# Patient Record
Sex: Female | Born: 1975 | Race: Black or African American | Hispanic: No | Smoking: Current every day smoker
Health system: Southern US, Community
[De-identification: ages and names within clinical notes are randomized; demographics above are authoritative.]

## PROBLEM LIST (undated history)

## (undated) DIAGNOSIS — N83209 Unspecified ovarian cyst, unspecified side: Secondary | ICD-10-CM

## (undated) DIAGNOSIS — E669 Obesity, unspecified: Secondary | ICD-10-CM

## (undated) DIAGNOSIS — Z720 Tobacco use: Secondary | ICD-10-CM

## (undated) DIAGNOSIS — G5602 Carpal tunnel syndrome, left upper limb: Secondary | ICD-10-CM

## (undated) DIAGNOSIS — N83201 Unspecified ovarian cyst, right side: Secondary | ICD-10-CM

## (undated) DIAGNOSIS — M5416 Radiculopathy, lumbar region: Secondary | ICD-10-CM

## (undated) DIAGNOSIS — M5412 Radiculopathy, cervical region: Secondary | ICD-10-CM

## (undated) HISTORY — DX: Unspecified ovarian cyst, right side: N83.201

## (undated) HISTORY — DX: Obesity, unspecified: E66.9

## (undated) HISTORY — DX: Unspecified ovarian cyst, unspecified side: N83.209

## (undated) HISTORY — PX: TONSILECTOMY, ADENOIDECTOMY, BILATERAL MYRINGOTOMY AND TUBES: SHX2538

## (undated) HISTORY — DX: Tobacco use: Z72.0

## (undated) HISTORY — DX: Carpal tunnel syndrome, left upper limb: G56.02

## (undated) HISTORY — DX: Radiculopathy, cervical region: M54.12

## (undated) HISTORY — DX: Radiculopathy, lumbar region: M54.16

---

## 1998-04-10 ENCOUNTER — Encounter: Admission: RE | Admit: 1998-04-10 | Discharge: 1998-04-10 | Payer: Self-pay | Admitting: Family Medicine

## 1998-11-16 ENCOUNTER — Emergency Department (HOSPITAL_COMMUNITY): Admission: EM | Admit: 1998-11-16 | Discharge: 1998-11-17 | Payer: Self-pay | Admitting: Emergency Medicine

## 1998-11-17 ENCOUNTER — Inpatient Hospital Stay (HOSPITAL_COMMUNITY): Admission: AD | Admit: 1998-11-17 | Discharge: 1998-11-17 | Payer: Self-pay | Admitting: Obstetrics

## 1998-11-26 ENCOUNTER — Encounter: Admission: RE | Admit: 1998-11-26 | Discharge: 1998-11-26 | Payer: Self-pay | Admitting: Family Medicine

## 2000-04-15 ENCOUNTER — Ambulatory Visit (HOSPITAL_COMMUNITY): Admission: RE | Admit: 2000-04-15 | Discharge: 2000-04-15 | Payer: Self-pay | Admitting: *Deleted

## 2000-05-11 ENCOUNTER — Encounter: Admission: RE | Admit: 2000-05-11 | Discharge: 2000-05-11 | Payer: Self-pay | Admitting: Obstetrics & Gynecology

## 2001-10-22 ENCOUNTER — Inpatient Hospital Stay (HOSPITAL_COMMUNITY): Admission: AD | Admit: 2001-10-22 | Discharge: 2001-10-24 | Payer: Self-pay | Admitting: Obstetrics and Gynecology

## 2002-06-24 HISTORY — PX: TUBAL LIGATION: SHX77

## 2002-09-22 ENCOUNTER — Emergency Department (HOSPITAL_COMMUNITY): Admission: EM | Admit: 2002-09-22 | Discharge: 2002-09-23 | Payer: Self-pay

## 2002-09-29 ENCOUNTER — Emergency Department (HOSPITAL_COMMUNITY): Admission: EM | Admit: 2002-09-29 | Discharge: 2002-09-29 | Payer: Self-pay | Admitting: Emergency Medicine

## 2002-09-29 ENCOUNTER — Encounter: Payer: Self-pay | Admitting: Emergency Medicine

## 2002-11-27 ENCOUNTER — Emergency Department (HOSPITAL_COMMUNITY): Admission: EM | Admit: 2002-11-27 | Discharge: 2002-11-27 | Payer: Self-pay | Admitting: Emergency Medicine

## 2002-12-25 ENCOUNTER — Encounter: Admission: RE | Admit: 2002-12-25 | Discharge: 2002-12-25 | Payer: Self-pay | Admitting: Internal Medicine

## 2003-01-01 ENCOUNTER — Ambulatory Visit (HOSPITAL_COMMUNITY): Admission: RE | Admit: 2003-01-01 | Discharge: 2003-01-01 | Payer: Self-pay | Admitting: Internal Medicine

## 2003-01-01 ENCOUNTER — Encounter: Payer: Self-pay | Admitting: Internal Medicine

## 2003-01-08 ENCOUNTER — Encounter: Admission: RE | Admit: 2003-01-08 | Discharge: 2003-04-08 | Payer: Self-pay | Admitting: Infectious Diseases

## 2003-02-05 ENCOUNTER — Inpatient Hospital Stay (HOSPITAL_COMMUNITY): Admission: AD | Admit: 2003-02-05 | Discharge: 2003-02-06 | Payer: Self-pay | Admitting: *Deleted

## 2003-05-07 ENCOUNTER — Emergency Department (HOSPITAL_COMMUNITY): Admission: EM | Admit: 2003-05-07 | Discharge: 2003-05-07 | Payer: Self-pay | Admitting: Emergency Medicine

## 2003-06-25 ENCOUNTER — Encounter (INDEPENDENT_AMBULATORY_CARE_PROVIDER_SITE_OTHER): Payer: Self-pay | Admitting: *Deleted

## 2003-07-15 ENCOUNTER — Encounter (INDEPENDENT_AMBULATORY_CARE_PROVIDER_SITE_OTHER): Payer: Self-pay | Admitting: *Deleted

## 2003-07-15 ENCOUNTER — Encounter: Admission: RE | Admit: 2003-07-15 | Discharge: 2003-07-15 | Payer: Self-pay | Admitting: Family Medicine

## 2003-07-15 ENCOUNTER — Other Ambulatory Visit: Admission: RE | Admit: 2003-07-15 | Discharge: 2003-07-15 | Payer: Self-pay | Admitting: Family Medicine

## 2003-08-01 ENCOUNTER — Encounter: Admission: RE | Admit: 2003-08-01 | Discharge: 2003-08-01 | Payer: Self-pay | Admitting: Sports Medicine

## 2003-08-05 ENCOUNTER — Encounter: Admission: RE | Admit: 2003-08-05 | Discharge: 2003-08-05 | Payer: Self-pay | Admitting: Sports Medicine

## 2003-08-22 ENCOUNTER — Encounter: Admission: RE | Admit: 2003-08-22 | Discharge: 2003-08-22 | Payer: Self-pay | Admitting: Family Medicine

## 2003-09-09 ENCOUNTER — Encounter: Admission: RE | Admit: 2003-09-09 | Discharge: 2003-09-09 | Payer: Self-pay | Admitting: Family Medicine

## 2003-09-09 ENCOUNTER — Encounter: Admission: RE | Admit: 2003-09-09 | Discharge: 2003-09-09 | Payer: Self-pay | Admitting: Sports Medicine

## 2003-09-18 ENCOUNTER — Encounter: Admission: RE | Admit: 2003-09-18 | Discharge: 2003-09-18 | Payer: Self-pay | Admitting: Family Medicine

## 2006-07-22 ENCOUNTER — Encounter (INDEPENDENT_AMBULATORY_CARE_PROVIDER_SITE_OTHER): Payer: Self-pay | Admitting: *Deleted

## 2007-06-20 ENCOUNTER — Emergency Department (HOSPITAL_COMMUNITY): Admission: EM | Admit: 2007-06-20 | Discharge: 2007-06-21 | Payer: Self-pay | Admitting: Emergency Medicine

## 2007-10-03 ENCOUNTER — Ambulatory Visit: Payer: Self-pay | Admitting: Family Medicine

## 2007-10-04 ENCOUNTER — Emergency Department (HOSPITAL_COMMUNITY): Admission: EM | Admit: 2007-10-04 | Discharge: 2007-10-04 | Payer: Self-pay | Admitting: Emergency Medicine

## 2007-10-24 ENCOUNTER — Ambulatory Visit: Payer: Self-pay | Admitting: Family Medicine

## 2007-10-24 ENCOUNTER — Encounter (INDEPENDENT_AMBULATORY_CARE_PROVIDER_SITE_OTHER): Payer: Self-pay | Admitting: Family Medicine

## 2007-10-24 ENCOUNTER — Other Ambulatory Visit: Admission: RE | Admit: 2007-10-24 | Discharge: 2007-10-24 | Payer: Self-pay | Admitting: Family Medicine

## 2007-10-24 DIAGNOSIS — F17200 Nicotine dependence, unspecified, uncomplicated: Secondary | ICD-10-CM | POA: Insufficient documentation

## 2007-10-30 ENCOUNTER — Encounter (INDEPENDENT_AMBULATORY_CARE_PROVIDER_SITE_OTHER): Payer: Self-pay | Admitting: Family Medicine

## 2007-10-30 ENCOUNTER — Ambulatory Visit: Payer: Self-pay | Admitting: Family Medicine

## 2007-10-30 DIAGNOSIS — E669 Obesity, unspecified: Secondary | ICD-10-CM

## 2007-10-30 LAB — CONVERTED CEMR LAB
ALT: 11 units/L (ref 0–35)
Alkaline Phosphatase: 63 units/L (ref 39–117)
Creatinine, Ser: 0.68 mg/dL (ref 0.40–1.20)
LDL Cholesterol: 82 mg/dL (ref 0–99)
MCHC: 33.7 g/dL (ref 30.0–36.0)
MCV: 98.6 fL (ref 78.0–100.0)
Platelets: 328 10*3/uL (ref 150–400)
Sodium: 137 meq/L (ref 135–145)
Total Bilirubin: 0.6 mg/dL (ref 0.3–1.2)
Total CHOL/HDL Ratio: 2.8
Total Protein: 6.6 g/dL (ref 6.0–8.3)
Triglycerides: 50 mg/dL (ref <150)
VLDL: 10 mg/dL (ref 0–40)

## 2007-10-31 ENCOUNTER — Encounter (INDEPENDENT_AMBULATORY_CARE_PROVIDER_SITE_OTHER): Payer: Self-pay | Admitting: Family Medicine

## 2007-11-13 ENCOUNTER — Encounter (INDEPENDENT_AMBULATORY_CARE_PROVIDER_SITE_OTHER): Payer: Self-pay | Admitting: Family Medicine

## 2007-12-07 ENCOUNTER — Ambulatory Visit: Payer: Self-pay | Admitting: Family Medicine

## 2008-05-07 ENCOUNTER — Encounter: Admission: RE | Admit: 2008-05-07 | Discharge: 2008-05-07 | Payer: Self-pay | Admitting: Family Medicine

## 2008-05-07 ENCOUNTER — Ambulatory Visit: Payer: Self-pay | Admitting: Family Medicine

## 2008-05-07 ENCOUNTER — Telehealth: Payer: Self-pay | Admitting: *Deleted

## 2008-05-09 ENCOUNTER — Encounter: Payer: Self-pay | Admitting: *Deleted

## 2008-07-25 ENCOUNTER — Ambulatory Visit: Payer: Self-pay | Admitting: Family Medicine

## 2008-07-25 ENCOUNTER — Encounter: Payer: Self-pay | Admitting: Family Medicine

## 2008-07-25 ENCOUNTER — Other Ambulatory Visit: Admission: RE | Admit: 2008-07-25 | Discharge: 2008-07-25 | Payer: Self-pay | Admitting: Family Medicine

## 2008-07-25 LAB — CONVERTED CEMR LAB
Albumin: 4.1 g/dL (ref 3.5–5.2)
Alkaline Phosphatase: 70 units/L (ref 39–117)
BUN: 7 mg/dL (ref 6–23)
CO2: 22 meq/L (ref 19–32)
Calcium: 9.2 mg/dL (ref 8.4–10.5)
Glucose, Bld: 81 mg/dL (ref 70–99)
Hgb A1c MFr Bld: 5.2 %
Potassium: 3.9 meq/L (ref 3.5–5.3)
TSH: 0.967 microintl units/mL (ref 0.350–4.50)
Whiff Test: POSITIVE

## 2008-07-26 ENCOUNTER — Encounter: Payer: Self-pay | Admitting: Family Medicine

## 2008-08-06 ENCOUNTER — Emergency Department (HOSPITAL_COMMUNITY): Admission: EM | Admit: 2008-08-06 | Discharge: 2008-08-07 | Payer: Self-pay | Admitting: Emergency Medicine

## 2008-08-19 ENCOUNTER — Ambulatory Visit: Payer: Self-pay | Admitting: Family Medicine

## 2008-08-20 ENCOUNTER — Telehealth (INDEPENDENT_AMBULATORY_CARE_PROVIDER_SITE_OTHER): Payer: Self-pay | Admitting: Pharmacist

## 2008-09-05 ENCOUNTER — Ambulatory Visit: Payer: Self-pay | Admitting: Family Medicine

## 2008-09-05 ENCOUNTER — Telehealth: Payer: Self-pay | Admitting: Family Medicine

## 2008-12-22 ENCOUNTER — Emergency Department (HOSPITAL_COMMUNITY): Admission: EM | Admit: 2008-12-22 | Discharge: 2008-12-22 | Payer: Self-pay | Admitting: Family Medicine

## 2009-04-22 ENCOUNTER — Emergency Department (HOSPITAL_COMMUNITY): Admission: EM | Admit: 2009-04-22 | Discharge: 2009-04-22 | Payer: Self-pay | Admitting: Emergency Medicine

## 2009-05-01 ENCOUNTER — Ambulatory Visit: Payer: Self-pay | Admitting: Family Medicine

## 2009-07-17 ENCOUNTER — Ambulatory Visit: Payer: Self-pay | Admitting: Family Medicine

## 2009-08-19 ENCOUNTER — Ambulatory Visit: Payer: Self-pay | Admitting: Family Medicine

## 2009-08-19 ENCOUNTER — Encounter: Payer: Self-pay | Admitting: Family Medicine

## 2009-08-19 LAB — CONVERTED CEMR LAB
ALT: 9 units/L (ref 0–35)
AST: 13 units/L (ref 0–37)
Albumin: 3.9 g/dL (ref 3.5–5.2)
CO2: 19 meq/L (ref 19–32)
Calcium: 9.1 mg/dL (ref 8.4–10.5)
Chloride: 107 meq/L (ref 96–112)
Cholesterol: 144 mg/dL (ref 0–200)
HCT: 42.8 % (ref 36.0–46.0)
Platelets: 298 10*3/uL (ref 150–400)
Potassium: 4.4 meq/L (ref 3.5–5.3)
RDW: 14.5 % (ref 11.5–15.5)
Sodium: 138 meq/L (ref 135–145)
TSH: 1.166 microintl units/mL (ref 0.350–4.500)
Total CK: 31 units/L (ref 7–177)
Total Protein: 6.6 g/dL (ref 6.0–8.3)
Vit D, 25-Hydroxy: <10 ng/mL — ABNORMAL LOW (ref 30–89)
WBC: 5.5 10*3/uL (ref 4.0–10.5)

## 2009-08-27 ENCOUNTER — Ambulatory Visit: Payer: Self-pay | Admitting: Family Medicine

## 2009-08-27 ENCOUNTER — Other Ambulatory Visit: Admission: RE | Admit: 2009-08-27 | Discharge: 2009-08-27 | Payer: Self-pay | Admitting: Family Medicine

## 2009-08-27 ENCOUNTER — Encounter: Payer: Self-pay | Admitting: Family Medicine

## 2009-08-27 LAB — CONVERTED CEMR LAB
Chlamydia, DNA Probe: NEGATIVE
Whiff Test: POSITIVE

## 2009-08-31 ENCOUNTER — Encounter: Payer: Self-pay | Admitting: Family Medicine

## 2009-11-10 ENCOUNTER — Telehealth: Payer: Self-pay | Admitting: Family Medicine

## 2009-11-12 ENCOUNTER — Ambulatory Visit: Payer: Self-pay | Admitting: Family Medicine

## 2009-11-13 ENCOUNTER — Ambulatory Visit (HOSPITAL_COMMUNITY): Admission: RE | Admit: 2009-11-13 | Discharge: 2009-11-13 | Payer: Self-pay | Admitting: Family Medicine

## 2010-01-02 ENCOUNTER — Ambulatory Visit: Payer: Self-pay | Admitting: Family Medicine

## 2010-02-19 ENCOUNTER — Ambulatory Visit: Payer: Self-pay | Admitting: Family Medicine

## 2010-02-19 ENCOUNTER — Telehealth: Payer: Self-pay | Admitting: *Deleted

## 2010-02-19 DIAGNOSIS — IMO0002 Reserved for concepts with insufficient information to code with codable children: Secondary | ICD-10-CM

## 2010-02-19 DIAGNOSIS — M5412 Radiculopathy, cervical region: Secondary | ICD-10-CM | POA: Insufficient documentation

## 2010-02-20 ENCOUNTER — Encounter: Admission: RE | Admit: 2010-02-20 | Discharge: 2010-02-20 | Payer: Self-pay | Admitting: Sports Medicine

## 2010-03-12 ENCOUNTER — Ambulatory Visit: Payer: Self-pay | Admitting: Radiology

## 2010-03-12 ENCOUNTER — Emergency Department (HOSPITAL_BASED_OUTPATIENT_CLINIC_OR_DEPARTMENT_OTHER): Admission: EM | Admit: 2010-03-12 | Discharge: 2010-03-12 | Payer: Self-pay | Admitting: Emergency Medicine

## 2010-03-17 ENCOUNTER — Ambulatory Visit: Payer: Self-pay | Admitting: Family Medicine

## 2010-03-17 DIAGNOSIS — N83209 Unspecified ovarian cyst, unspecified side: Secondary | ICD-10-CM

## 2010-03-17 HISTORY — DX: Unspecified ovarian cyst, unspecified side: N83.209

## 2010-03-20 ENCOUNTER — Telehealth: Payer: Self-pay | Admitting: *Deleted

## 2010-03-23 ENCOUNTER — Telehealth: Payer: Self-pay | Admitting: *Deleted

## 2010-04-01 ENCOUNTER — Encounter: Payer: Self-pay | Admitting: Family Medicine

## 2010-06-23 NOTE — Assessment & Plan Note (Signed)
Summary: pap/eo   Vital Signs:  Patient profile:   35 year old female Height:      63.5 inches Weight:      253 pounds BMI:     44.27 Temp:     98.0 degrees F oral Pulse rate:   87 / minute BP sitting:   117 / 75  (right arm) Cuff size:   large  Vitals Entered By: Tessie Fass CMA (August 27, 2009 9:41 AM) CC: pap Is Patient Diabetic? No Pain Assessment Patient in pain? yes     Location: right leg Intensity: 7   Primary Care Provider:  Lynzi Meulemans MD  CC:  pap.  History of Present Illness: 35 y/o healthy F here for pap  GYN:  LMP March 12-13.  Regulary 3 1/2 days.  Heavy bleeding first 2 days.  Wears both tampons+pads for first two days.  Not interested in OCP to regulate cycle.  Sexually active with boyfriend. +discharge (white)  Obesity:  Would like to lose weight.  States today "I can't weight 135 lbs like the chart says I need to weight.  I would be too skinny then".  She states that she does not have time to exercise.  She attends college, works, and has 3 children.  Not healthy diet.    Habits & Providers  Alcohol-Tobacco-Diet     Tobacco Status: current     Cigarette Packs/Day: 0.75  Current Medications (verified): 1)  None  Allergies (verified): 1)  Sulfa  Past History:  Past Medical History: Last updated: 07/17/2009 Chlamydia 2/05, neg HIV, RPR; Chl TOC neg,  [id number],  h/o abnl pap s/p cryotx, subsequent nl pap, R ATF sprain 4/05 Trich 3/10.  Past Surgical History: Last updated: 07/17/2009 BTL - 05/24/2002 Pelvic US- prob. fibroid dz, nl ovaries - 08/22/2003,  T & A - 07/15/2003  Family History: Last updated: 07/17/2009 -gma with CVD and CAD, HTN, DM, - multiple relatives w/ fibroids requiring TAH,  -PGM passed from cervical cancer at age 45s -No colon ca  Social History: Last updated: 08/27/2009 Divorced 2009.  Boyfriend Jimmy. Sexually active. Using condoms. Cutting down on smoking  ~1/2 ppd (used to smoke up to 2 ppds, started when she was  35 y/o).   Social alcohol.  No rec drugs.   Living with 3 kids (13, 8, 7).   [employer] full time. Plans to graduate 04/2011 with BS in Psychology.  Risk Factors: Alcohol Use: <1 (07/17/2009) Diet: Needs to eat more healthy, check portions (07/17/2009) Exercise: no (07/17/2009)  Risk Factors: Smoking Status: current (08/27/2009) Packs/Day: 0.75 (08/27/2009)  Social History: Divorced 2009.  Boyfriend Jimmy. Sexually active. Using condoms. Cutting down on smoking  ~1/2 ppd (used to smoke up to 2 ppds, started when she was 35 y/o).   Social alcohol.  No rec drugs.   Living with 3 kids (13, 8, 7).   [employer] full time. Plans to graduate 04/2011 with BS in Psychology. Packs/Day:  0.75  Review of Systems General:  Denies chills, fever, and loss of appetite. GU:  Denies abnormal vaginal bleeding, discharge, dysuria, genital sores, hematuria, incontinence, and urinary frequency.  Physical Exam  General:  Well-developed,well-nourished,in no acute distress; alert,appropriate and cooperative throughout examination. vitals reviewed. Abdomen:  Bowel sounds positive,abdomen soft and non-tender without masses, organomegaly or hernias noted. obese Genitalia:  Normal introitus for age, no external lesions, +malodorous vaginal discharge, mucosa pink and moist, no vaginal or cervical lesions, no vaginal atrophy, no friaility or hemorrhage, normal  uterus size and position, no adnexal masses or tenderness Neurologic:  alert & oriented X3 and cranial nerves II-XII intact.   Psych:  Oriented X3 and memory intact for recent and remote.     Impression & Recommendations:  Problem # 1:  Gynecological examination-routine (ICD-V72.31) Assessment Unchanged Exam wnl, but + malodorous discharge.  Wet prep + clue cells + whiff test.  Tried to call pt at phone number she gave me [phone], but automated message states "This subscriber is not accepting calls at this time."  Will send letter to  home and [facility] to [facility] on [address].    Problem # 2:  CONTACT OR EXPOSURE TO OTHER VIRAL DISEASES (ICD-V01.79) GC/Chlamydia sample done. Orders: GC/Chlamydia-[facility] ([id number])  Problem # 3:  OBESITY, UNSPECIFIED (ICD-278.00) Assessment: Deteriorated  Pt continues to state that she does not have time to exercise.  Did not follow calories-count plan given at last visit.  She is interested in talking with Dr Gerilyn Pilgrim.  Pt to make appt.   Orders: [facility]- Est Level  3 (09811)  Problem # 4:  NICOTINE ADDICTION (ICD-305.1) Assessment: Unchanged Not interested in quitting.   Complete Medication List: 1)  Metronidazole 500 Mg Tabs (Metronidazole) .Marland Kitchen.. 1 tab by mouth two times a day x 7 days  Other Orders: Pap Smear-[facility] ([id number]) [facility] (13086) Pap Smear- [facility] (Pap)  Patient Instructions: 1)  Please schedule a follow-up appointment in 4-6 months after you've seen Dr Riley Kill .  2)  Make appointment with Dr Gerilyn Pilgrim for Nutrition support.  Prescriptions: METRONIDAZOLE 500 MG TABS (METRONIDAZOLE) 1 tab by mouth two times a day x 7 days  #14 x 0   Entered and Authorized by:   Angeline Slim MD   Signed by:   Angeline Slim MD on 08/27/2009   Method used:   [facility] to        [facility] [phone][facility] (retail)       [address]       [place], [place]  [address]       Ph: [phone]       Fax: [phone]   RxID:   [id number]   Laboratory Results  Date/Time Received: August 27, 2009 10:17 AM  Date/Time Reported: August 27, 2009 10:31 AM   [place] Source: vaginal WBC/hpf: 1-5 Bacteria/hpf: 3+  Cocci Clue cells/hpf: few  Positive whiff Yeast/hpf: none Trichomonas/hpf: none Comments: rod bacteria also present...........test performed by...........Marland KitchenTerese Door, CMA

## 2010-06-23 NOTE — Assessment & Plan Note (Signed)
Summary: cpe,df   Vital Signs:  Patient profile:   35 year old female Height:      63.5 inches Weight:      247 pounds BMI:     43.22 Temp:     97.8 degrees F oral Pulse rate:   94 / minute BP sitting:   112 / 76  (left arm) Cuff size:   large  Vitals Entered By: Tessie Fass CMA (July 17, 2009 9:04 AM) CC: complete physical Is Patient Diabetic? No Pain Assessment Patient in pain? no        Primary Care Provider:  Aleathea Pugmire MD  CC:  complete physical.  History of Present Illness: cc: cpe  R knee pain: started 1 month ago, does not recall injury.  Whole right leg feels tingly, numbness.  No urinary or bowel problems.  No swellilng.  No erythema.  Constant pain.  Right knee feels like it slips out on her.  Feels like she is falling due to leg numbness.  Upon further questioning pt states it feels more like numbness.  No pain.  No urinary or bowel incontinence.  no fever, chills.    Obesity: not exercising.  not dieting.  not paying attention to what she is eating.    Tobacco abuse:  smoking 1/2 ppd.  Started at age 77.  Used to smoke 2ppd.     Habits & Providers  Alcohol-Tobacco-Diet     Alcohol drinks/day: <1     Alcohol Counseling: not indicated; patient does not drink     Tobacco Status: current     Tobacco Counseling: to quit use of tobacco products     Cigarette Packs/Day: 0.5     Year Started: 1991     Diet Comments: Needs to eat more healthy, check portions  Exercise-Depression-Behavior     Does Patient Exercise: no     Exercise Counseling: to improve exercise regimen     Have you felt down or hopeless? no     Have you felt little pleasure in things? no     Depression Counseling: not indicated; screening negative for depression     STD Risk: never     Drug Use: never     Seat Belt Use: sometimes     Seat Belt Counseling: Uses seatbelt when kids are in the car     Sun Exposure: infrequent  Current Medications (verified): 1)  None  Allergies  (verified): 1)  Sulfa  Past History:  Past Medical History: Chlamydia 2/05, neg HIV, RPR; Chl TOC neg,  [id number],  h/o abnl pap s/p cryotx, subsequent nl pap, R ATF sprain 4/05 Trich 3/10.  Past Surgical History: BTL - 05/24/2002 Pelvic US- prob. fibroid dz, nl ovaries - 08/22/2003,  T & A - 07/15/2003  Family History: -gma with CVD and CAD, HTN, DM, - multiple relatives w/ fibroids requiring TAH,  -PGM passed from cervical cancer at age 66s -No colon ca  Social History: Divorced 2009.  Boyfriend Chanetta Marshall of 4 months. Sexually active. Using condoms. Cutting down on smoking  ~1/2 ppd (used to smoke up to 2 ppds, started when she was 35 y/o).   Social alcohol.  No rec drugs.   Living with 3 kids (13, 8, 7).   [employer] full time. Plans to graduate 09/2010 with BS in Psychology. Packs/Day:  0.5 Does Patient Exercise:  no STD Risk:  never Drug Use:  never Seat Belt Use:  sometimes Sun Exposure-Excessive:  infrequent  Review of Systems  The patient denies anorexia, fever, weight loss, vision loss, decreased hearing, hoarseness, chest pain, syncope, dyspnea on exertion, peripheral edema, prolonged cough, headaches, hemoptysis, abdominal pain, melena, hematochezia, severe indigestion/heartburn, hematuria, incontinence, genital sores, muscle weakness, suspicious skin lesions, transient blindness, depression, unusual weight change, abnormal bleeding, enlarged lymph nodes, and breast masses.    Physical Exam  General:  Well-developed,well-nourished,in no acute distress; alert,appropriate and cooperative throughout examination. vitals reviewed.  Head:  Normocephalic and atraumatic without obvious abnormalities. No apparent alopecia or balding. Eyes:  vision grossly intact.   Ears:  External ear exam shows no significant lesions or deformities.  Otoscopic examination reveals clear canals, tympanic membranes are intact bilaterally without bulging, retraction, inflammation or discharge.  Hearing is grossly normal bilaterally. Nose:  External nasal examination shows no deformity or inflammation. Nasal mucosa are pink and moist without lesions or exudates. Mouth:  Oral mucosa and oropharynx without lesions or exudates.  Teeth in good repair. Neck:  supple and full ROM.   Lungs:  Normal respiratory effort, chest expands symmetrically. Lungs are clear to auscultation, no crackles or wheezes. Heart:  Normal rate and regular rhythm. S1 and S2 normal without gallop, murmur, click, rub or other extra sounds. Abdomen:  Bowel sounds positive,abdomen soft and non-tender without masses, organomegaly or hernias noted. obese.  Msk:  No deformity or scoliosis noted of thoracic or lumbar spine.   Pulses:  R and L carotid,radial,femoral,dorsalis pedis and posterior tibial pulses are full and equal bilaterally Extremities:  No clubbing, cyanosis, edema, or deformity noted with normal full range of motion of all joints.    Right leg: no hip or knee joint pain. No swelling.  no redness.  knee and hip stable.  gait stable.  straight leg raise no tenderness.  no locking.  extension/flexsion intact and no pain.  Neurologic:  No cranial nerve deficits noted. Station and gait are normal. Plantar reflexes are down-going bilaterally. DTRs are symmetrical throughout. Sensory, motor and coordinative functions appear intact. Skin:  Intact without suspicious lesions or rashes Cervical Nodes:  No lymphadenopathy noted Axillary Nodes:  No palpable lymphadenopathy Psych:  Oriented X3, memory intact for recent and remote, normally interactive, good eye contact, not anxious appearing, not depressed appearing, not agitated, not suicidal, and not homicidal.     Impression & Recommendations:  Problem # 1:  NUMBNESS (ICD-782.0) Numbness in legs most likely from impingement of femoral nerve due to obesity.  Exam negative.  No urinary/bowel symptoms.  Full ROM.  No joint pain.  Will check CK, but most likely will be  normal.  Would need weight loss.  Future Orders: CK (Creatine Kinase)-[facility] [id number]) ... 06/26/2010  Problem # 2:  NICOTINE ADDICTION (ICD-305.1) Assessment: Unchanged Advised pt to quit.  She is not interested in quitting at this time, although she states she has tried in past and is trying to cut down.    Problem # 3:  OBESITY, UNSPECIFIED (ICD-278.00) Assessment: Unchanged BMI 43.  Pt not exercising.  She states she does not have time to exercise.  Pt has treadmill in apt complex where she lives.  Advised pt to use the treadmill and walk while she is reading school work.  We discussed food portions (Dr Gerilyn Pilgrim' 1/2 plate vegetables, palm-sized for proteins and starch).  Will check TSH.   Future Orders: Lipid-[facility] ([id number]) ... 06/27/2010 Comp Met-[facility] ([id number]) ... 07/02/2010 CBC-[facility] ([id number]) ... 06/26/2010 TSH-[facility] ([id number]) ... 06/26/2010 CK (Creatine Kinase)-[facility] [id number]) ... 06/26/2010  Problem # 4:  SCREENING FOR MALIGNANT NEOPLASM OF  THE CERVIX (ICD-V76.2) Assessment: Comment Only Will need to come back for this test.  Hx of abnormal pap 2005.  Pap 2009, 2010 negative.  If this one negative then can repeat in 3 yrs.   Other Orders: Future Orders: Vit D, 25 OH-[facility] ([id number]) ... 06/25/2010  Patient Instructions: 1)  Please schedule a follow-up appointment in March for pap.  2)  Please make appt for cholesterol check one week before pap appointment.  This has to be fasting, so no food or drink after midnight the night before appointment. 3)  Tobacco is very bad for your health and your loved ones ! You should stop smoking !  4)  Stop smoking tips: Choose a quit date. Cut down before the quit date. Decide what you will do as a substitute when you feel the urge to smoke(gum, toothpick, exercise).  5)  It is important that you exercise reguarly at least 20 minutes 5 times a week. If you develop chest pain, have severe difficulty breathing, or feel very tired, stop  exercising immediately and seek medical attention.  6)  You need to lose weight. Consider a lower calorie diet and regular exercise.    Prevention & Chronic Care Immunizations   Influenza vaccine: Not documented    Tetanus booster: 06/25/2003: Done.   Tetanus booster due: 06/24/2013    Pneumococcal vaccine: Not documented  Other Screening   Pap smear: NEGATIVE FOR INTRAEPITHELIAL LESIONS OR MALIGNANCY.  (07/25/2008)   Pap smear due: 06/24/2004   Smoking status: current  (07/17/2009)   Smoking cessation counseling: YES  (07/17/2009)   Target quit date: 08/19/2008  (08/19/2008)  Appended Document: cpe,df
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
## 2010-06-23 NOTE — Assessment & Plan Note (Signed)
Summary: dizzy/lightheaded,df   Vital Signs:  Patient profile:   35 year old female Height:      63.5 inches Weight:      243.8 pounds BMI:     42.66 Temp:     98.4 degrees F oral Pulse rate:   91 / minute Pulse (ortho):   99 / minute BP sitting:   109 / 73  (left arm) BP standing:   103 / 73 Cuff size:   large  Vitals Entered By: Garen Grams LPN (January 02, 2010 2:12 PM) CC: dizziness and headache x 2 days Is Patient Diabetic? No Pain Assessment Patient in pain? no        Serial Vital Signs/Assessments:  Time      Position  BP       Pulse  Resp  Temp     By 2:13 PM   Lying RA  105/72   85                    Asha Benton LPN 1:61 PM   Sitting   102/71   93                    Asha Benton LPN 0:96 PM   Standing  103/73   99                    Asha Benton LPN   Primary Care Provider:  Cat Ta MD  CC:  dizziness and headache x 2 days.  History of Present Illness: CC: left side acting funny  1d h/o sharp pain left head, dizzy spells.  Pt was seen for this previously in June and had gone away with time and alittle flexaril.     HA - left sided sharp pain seems worse with changing positions from sitting or standing, "seeing stars".  No photo/phonophobia. Pt .  Makes her stopoes report some nausea associated with it buut no vomiting.  + h/o migraines but normally has photo/nausea associated with them.  Neck muscles are always  tight, more tight now due to stress in life.   dizzyness - feels room spinning (vertigo), no double vision, lightheaded, imbalanced,   No feelings of presyncope.  + postural hypotension.  Lasts seconds.  denies falling denies trauma no hearing problem no ringing in ears   No confusion, AMS, anmesia, urinary changes, CP, SOB, abd pain, recent fevers, chills.  Tried excedrin for headache which didn't really help. Has flexaril from last time this occured but has only taken one so far.  Smokes <1/2 ppd.  No herbals/supplements.  CT head in june  normal  Habits & Providers  Alcohol-Tobacco-Diet     Tobacco Status: current     Tobacco Counseling: to quit use of tobacco products     Cigarette Packs/Day: 0.5  Current Medications (verified): 1)  Naprosyn 500 Mg Tabs (Naproxen) .... Two Times A Day For 7 Days With Food Then As Needed 2)  Flexeril 5 Mg Tabs (Cyclobenzaprine Hcl) .... Take One By Mouth Three Times A Day As Needed Headaches 3)  Amoxicillin-Pot Clavulanate 875-125 Mg Tabs (Amoxicillin-Pot Clavulanate) .... Take 1 Tab By Mouth Two Times A Day 4)  Ondansetron Hcl 4 Mg Tabs (Ondansetron Hcl) .... Take 1 Tab By Mouth Q8hr As Needed For Nausea  Allergies (verified): 1)  Sulfa  Review of Systems       see hpi  Physical Exam  General:  Well-developed,well-nourished,in no acute distress; alert,appropriate and  cooperative throughout examination. vitals reviewed. Pt thoug slumped over with head on sink area.  Head:  Normocephalic and atraumatic without obvious abnormalities. No apparent alopecia or balding. Eyes:  No corneal or conjunctival inflammation noted. EOMI. Perrla. Funduscopic exam benign, without  papilledema. Vision grossly normal. Ears:  External ear exam shows no significant lesions or deformities.  Otoscopic examination reveals clear canals, LeftTMr is retracted red no discharge . Hearing is grossly normal bilaterally. Nose:  External nasal examination shows no deformity or inflammation. Nasal mucosa are pink and moist without lesions or exudates. Mouth:  Oral mucosa and oropharynx without lesions or exudates.  Teeth in good repair. Lungs:  Normal respiratory effort, chest expands symmetrically. Lungs are clear to auscultation, no crackles or wheezes. Heart:  Normal rate and regular rhythm. S1 and S2 normal without gallop, murmur, click, rub or other extra sounds. Pulses:  R and L carotid,radial,femoral,dorsalis pedis and posterior tibial pulses are full and equal bilaterally Extremities:  nonpitting edema Skin:   Intact without suspicious lesions or rashes   Impression & Recommendations:  Problem # 1:  OTITIS MEDIA (ICD-382.9) Pt has signs of eaar infection could be causing inflammation of the CN 8.  Will treat with augmention x 1 week, use naproxen for inflammation.  Other possibilities include BPPV, labrinitis, meniere;s disease.  If does not help may think of trying diueretic.  Her updated medication list for this problem includes:    Naprosyn 500 Mg Tabs (Naproxen) .Marland Kitchen..Marland Kitchen Two times a day for 7 days with food then as needed    Amoxicillin-pot Clavulanate 875-125 Mg Tabs (Amoxicillin-pot clavulanate) .Marland Kitchen... Take 1 tab by mouth two times a day  Orders: [facility]- Est Level  3 ([address])  Problem # 2:  HEADACHE (ICD-784.0) could be associated with the ear infection could be also complex migraine or tension headache with findings of neck stifness.  Her uWill allow pt to take flexaril still and add naproxenfor anti inflammatory properties.  CT head was normal 2 months ago, if continue consider getting MRI.     Naprosyn 500 Mg Tabs (Naproxen) .Marland Kitchen..Marland Kitchen Two times a day for 7 days with food then as needed  Orders: [facility]- Est Level  3 ([address])  Problem # 3:  Dizziness See Om, differntial could be otitis media, BPPV, meniere's disease, labrinthitis. Will monitor. Could consider trying diuretic but will need to be careful due to pt having low bp but orthostatics were negative.   Complete Medication List: 1)  Naprosyn 500 Mg Tabs (Naproxen) .... Two times a day for 7 days with food then as needed 2)  Flexeril 5 Mg Tabs (Cyclobenzaprine hcl) .... Take one by mouth three times a day as needed headaches 3)  Amoxicillin-pot Clavulanate 875-125 Mg Tabs (Amoxicillin-pot clavulanate) .... Take 1 tab by mouth two times a day 4)  Ondansetron Hcl 4 Mg Tabs (Ondansetron hcl) .... Take 1 tab by mouth q8hr as needed for nausea  Patient Instructions: 1)  Nice to meet you 2)  I think you may have an ear infection.  Because you are so  dizzy and have a lot of pain lets treat this with some antibiotics.  Augmentin 1 tab by mouth two times a day for the next 7 days 3)  I will also give you a medicine for nausea 4)  Keep taking tghe flexaril as well because I think your neck is in spasm as well.  5)  Please follow up with your PCP Dr. Janalyn Harder in the next 2 weeks.  Prescriptions:  ONDANSETRON HCL 4 MG TABS (ONDANSETRON HCL) take 1 tab by mouth q8hr as needed for nausea  #20 x 0   Entered and Authorized by:   Antoine Primas DO   Signed by:   Antoine Primas DO on 01/02/2010   Method used:   Electronically to        [facility] [phone]* (retail)       [address]       [place], [place]  [address]       Ph: [phone]       Fax: [phone]   RxID:   [id number] AMOXICILLIN-POT CLAVULANATE 875-125 MG TABS (AMOXICILLIN-POT CLAVULANATE) take 1 tab by mouth two times a day  #14 x 0   Entered and Authorized by:   Antoine Primas DO   Signed by:   Antoine Primas DO on 01/02/2010   Method used:   Electronically to        [facility] [phone]* (retail)       [address]       [place], [place]  [address]       Ph: [phone]       Fax: [phone]   RxID:   [id number]

## 2010-06-23 NOTE — Letter (Signed)
Summary: Generic Letter  [facility]  [address]   [place], [place] [address]   Phone: [phone]  Fax: [phone]    08/27/2009  Carla Walters [address] [place], [place]  [address]  Dear [name],  We tried calling you by phone but were unable to reach you.  One of your tests, called the wet prep, which analyzes vaginal dishcarge under the microscope showed that you have bacterial vaginosis.  This is not sexually transmitted, but we should treat this an antibiotic called Metronidazole.  You need to take this two times a day for 7 days.  I[facility] a [facility].  Do not take this medication with alcohol as it can make you very ill.    If you have any questions, please do not hestitate to call us.    Sincerely,   Nila Winker MD  Appended Document: Generic Letter letter mailed.

## 2010-06-23 NOTE — Progress Notes (Signed)
Summary: referral/whog/ts  Phone Note Call from Patient Call back at Home Phone [phone]   Caller: Patient Summary of Call: pt is asking about referral to [facility] Initial call taken by: De Nurse,  March 20, 2010 4:31 PM  Follow-up for Phone Call        referral was faxed. they will call pt with appt after doc reviews info. Follow-up by: Arlyss Repress CMA,,  March 20, 2010 5:30 PM  Additional Follow-up for Phone Call Additional follow up Details #1::        CALLED PT. informed her, that we faxed the referral to [facility] 03-17-10. Additional Follow-up by: Arlyss Repress CMA,,  March 23, 2010 11:45 AM

## 2010-06-23 NOTE — Miscellaneous (Signed)
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

## 2010-06-23 NOTE — Letter (Signed)
Summary: Results Follow-up Letter  [facility]  [address]   [place], [place] [address]   Phone: [phone]  Fax: [phone]    08/31/2009  [address] [place], [place]  [address]  Dear Ms. Guin,   The following are the results of your recent test(s):  Test     Result     Pap Smear    Normal   Gonorrhea:       Negative  Chlamydia:     Negative      Sincerely,  Lynnelle Mesmer MD [facility]           Appended Document: Results Follow-up Letter letter mailed.

## 2010-06-23 NOTE — Progress Notes (Signed)
Summary: phone note/meds  Phone Note Outgoing Call   Call placed by: Loralee Pacas CMA,  February 19, 2010 1:33 PM Summary of Call: called and informed pt that she can take up to 3,600 mg of neurontin and mobic as directed.  f/u in 1-2 weeks Initial call taken by: Loralee Pacas CMA,  February 23, 2010 3:16 PM  Follow-up for Phone Call        Thanks Archie Patten, Can you also make sure she follows up 1-2 wks after her visit with me, I don't see that she made an appt.  XR showed straightening of her neck (should be curved), this means some spasm likely present and she needs rehab.  Cont the daily rehab exercises I gave her. Follow-up by: Rodney Langton MD,  February 22, 2010 11:42 AM  Additional Follow-up for Phone Call Additional follow up Details #1::        pt returned call - pls call in am. [phone] Additional Follow-up by: De Nurse,  February 24, 2010 2:55 PM    Additional Follow-up for Phone Call Additional follow up Details #2::    pt called again Follow-up by: De Nurse,  February 25, 2010 10:24 AM  Additional Follow-up for Phone Call Additional follow up Details #3:: Details for Additional Follow-up Action Taken: spoke with pt and put her on for monday 10.10.2011 per dr.thomas Additional Follow-up by: Loralee Pacas CMA,  February 25, 2010 10:49 AM

## 2010-06-23 NOTE — Progress Notes (Signed)
Summary: triage  Phone Note Call from Patient Call back at Home Phone [phone]   Caller: Patient Summary of Call: left side is going numb headaches/ dizzyness/ sharp pains in left of head Initial call taken by: De Nurse,  November 10, 2009 8:55 AM  Follow-up for Phone Call        c/o numbness on L side of body. sharp pains in L head. L hand is numb off & on. L knee "gives out".  has had some similar symptoms beore but not all at once.  denies vision problems, arm drift or facial irregularity. has to take mom to md this am & has to work in pm. strongly advised seeing md today. states she cannot come today or tomorrow. has appt wed.  gave her s/s CVA & told her to go to ED if symptoms worsen. all this started Thursday last week. she took tylenol all day that day & it did not work so she has taken nothing since. Again tried to get her to be seen today. she refused today ot tomorrow Follow-up by: Golden Circle RN,  November 10, 2009 9:02 AM

## 2010-06-23 NOTE — Assessment & Plan Note (Signed)
Summary: cyst on ovary,tcb   Vital Signs:  Patient profile:   35 year old female Height:      63.5 inches Weight:      248 pounds BMI:     43.40 Temp:     98.5 degrees F oral Pulse rate:   92 / minute BP sitting:   126 / 83  (left arm) Cuff size:   large  Vitals Entered By: Tessie Fass CMA (March 17, 2010 11:23 AM) CC: right ovarian cyst Pain Assessment Patient in pain? yes     Location: abdomen Intensity: 8   Primary Care Provider:  Alper Guilmette MD  CC:  right ovarian cyst.  History of Present Illness: 35 y/o F is here to discuss hysterectomy for ovarina cyst.  She was seen in ER on 03/12/10 for abd pain.  CT at that time showed no appendicitis, diverticulosis without diverticulitis, R ovarin cyst about 2cm.  States that her grandmother passed from ovarian cancer.  She has multiple family members that required TAH for fibroids before age 69.  She endorses that recently (this summer) her cousin passed away after an unknown ovarian cyst ruptured.  Because of her family history she is very concerned and desires TAH.    OB history: [id number]:  All three vaginal deliveries.  Births normal.  First pregnancy was complicated by syncope episodes so was on bed rest.  2nd pregancy had be best rest fully.  3rd pregnancy was normal.   1: 1996-1997: son 2: 1997: 1st trimester SAB 3: 1998: 1st trimester SAB 4: 1999: 1st trimester SAB 5: 2001: 1st trimester SAB 6: 2002: son 7: 2003: daughter  06/3002: BTL  GYN: history of abnormal paps in the past with workup including culposcopy and endometrial bx.  She has had regular paps that have been normal for the past 3 yrs.   Current Medications (verified): 1)  Neurontin 300 Mg Caps (Gabapentin) .... One By Mouth At Bedtime, Then One By Mouth Two Times A Day, The One By Mouth Three Times A Day. Incr As Needed 2)  Mobic 7.5 Mg Tabs (Meloxicam) .... One To Two Tabs By Mouth Daily For Pain.  Allergies (verified): 1)  Sulfa  Past  History:  Family History: Last updated: 03/17/2010 -gma with CVD and CAD, HTN, DM, - multiple relatives w/ fibroids requiring TAH,  -PGM passed from ovarian? or cervical? cancer at age 30s -No colon ca -sister, age 29, had partial hysterectomy d/t fibroids -cousin (summer 2011): died from ovarian cyst rupture  Social History: Last updated: 08/27/2009 Divorced 2009.  Boyfriend Jimmy. Sexually active. Using condoms. Cutting down on smoking  ~1/2 ppd (used to smoke up to 2 ppds, started when she was 35 y/o).   Social alcohol.  No rec drugs.   Living with 3 kids (13, 8, 7).   [employer] full time. Plans to graduate 04/2011 with BS in Psychology.  Risk Factors: Alcohol Use: <1 (07/17/2009) Diet: Needs to eat more healthy, check portions (07/17/2009) Exercise: no (07/17/2009)  Risk Factors: Smoking Status: current (02/19/2010) Packs/Day: 1.0 (02/19/2010)  Past Medical History: -[id number] -Chlamydia 2/05, neg HIV, RPR; Chl TOC neg,  -H/o abnl pap s/p cryotx, subsequent nl pap, R ATF sprain 4/05 -Trich 3/10.  Past Surgical History: -T & A - 1996 - D&Cs after each SAB -BTL - 06/24/2002: [facility] -Pelvic US- prob. fibroid dz, nl ovaries - 08/22/2003,   Family History: -gma with CVD and CAD, HTN, DM, - multiple relatives w/ fibroids requiring TAH,  -PGM  passed from ovarian? or cervical? cancer at age 14s -No colon ca -sister, age 95, had partial hysterectomy d/t fibroids -cousin (summer 2011): died from ovarian cyst rupture  Review of Systems General:  Denies chills, fatigue, fever, loss of appetite, malaise, sleep disorder, sweats, weakness, and weight loss. GI:  Complains of abdominal pain; denies bloody stools, change in bowel habits, constipation, nausea, and vomiting. GU:  Denies abnormal vaginal bleeding, discharge, dysuria, genital sores, hematuria, and incontinence.  Physical Exam  General:  Well-developed,well-nourished,in no acute distress; alert,appropriate and  cooperative throughout examination. vitals reviewed.  Psych:  Oriented X3.     Impression & Recommendations:  Problem # 1:  OVARIAN CYST (ICD-620.2) Assessment New R ovarian cyst, 2cm, was seen on abd/pelvic CT 03/12/10 when pt went to ER for abdominal pain.  Pt is very concerned about this.  Her cousin passed away this summer due to ovarian cyst rupture.  She also has strong family history of TAH due to fibroids.  She desires TAH because of this.  We discussed that there was not fibroid seen on CT.  Discussed that ovarian cysts are usually benign, but there is an increased risk of rupture, which is an emergency.  Pt would like referral for discussion about TAH.  Will refer to [facility].     Orders: Gynecologic Referral (Gyn) [facility]- Est Level  3 ([address])  Complete Medication List: 1)  Neurontin 300 Mg Caps (Gabapentin) .... One by mouth at bedtime, then one by mouth two times a day, the one by mouth three times a day. incr as needed 2)  Mobic 7.5 Mg Tabs (Meloxicam) .... One to two tabs by mouth daily for pain.   Orders Added: 1)  Gynecologic Referral [Gyn] 2)  [facility]- Est Level  3 [[address]]

## 2010-06-23 NOTE — Assessment & Plan Note (Signed)
Summary: tingling in left extremities,df   Vital Signs:  Patient profile:   35 year old female Height:      63.5 inches Weight:      243 pounds BMI:     42.52 Temp:     98.4 degrees F oral Pulse rate:   70 / minute BP sitting:   109 / 65  (left arm) Cuff size:   large  Vitals Entered By: Tessie Fass CMA (February 19, 2010 11:28 AM) CC: tingling in left extremities Is Patient Diabetic? No   Primary Care Provider:  Cat Ta MD  CC:  tingling in left extremities.  History of Present Illness: 35 yo female with arm and leg pain.  L Arm pain:  Present 1.5 weeks, located in C5/C6 distribution, doesnt go into hands, doesn't come from neck.  Not dropping things.  Feels a little weak.  No injury.  Has CTS and feels this is somewhat better.  Only taking motrin and washing it down with alcohol.  Pain is described as tingling.  Leg pain:  Shooting/tingling, down side and back of thigh.  Down leg.  Doesnt go into foot.  Worse when sitting a long time.  No bowel/bladder problems.  A little weak.    No symptoms in face, neck, or trunk.   Habits & Providers  Alcohol-Tobacco-Diet     Tobacco Status: current     Tobacco Counseling: to quit use of tobacco products     Cigarette Packs/Day: 1.0  Current Medications (verified): 1)  Neurontin 300 Mg Caps (Gabapentin) .... One By Mouth At Bedtime, Then One By Mouth Two Times A Day, The One By Mouth Three Times A Day. Incr As Needed 2)  Mobic 7.5 Mg Tabs (Meloxicam) .... One To Two Tabs By Mouth Daily For Pain.  Allergies (verified): 1)  Sulfa  Social History: Packs/Day:  1.0  Review of Systems       See HPI  Physical Exam  General:  Well-developed,well-nourished,in no acute distress; alert,appropriate and cooperative throughout examination Msk:  L arm:  Strength 4/5 to all movements in L upper extremity except hand with finger adduction 5/5.  paresthesias in C5/C6 distribution, sparing hand.  DTRs 2+ in biceps, brachioradialis, and  triceps.  cap refill <2sec.  L thigh:  DTRs 2+ to knee and achilles.  Strength 5/5.  No pain in back.  No palpable muscle spasm.  Spurlings test negative.   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 Neurontin 300 up-taper. RTC 1-2 weeks to fu. If no better on adequate doses of neurontin would refer for NCV/EMG testing.  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 Neurontin 300 up-taper. RTC 1-2 weeks to fu. If no better on adequate doses of neurontin would refer for NCV/EMG testing. Symptoms sound like sciatica but would like to ensure not coming from L-spine.  The following medications were removed from the medication list:    Naprosyn 500 Mg Tabs (Naproxen) .Marland Kitchen..Marland Kitchen Two times a day for 7 days with food then as needed    Flexeril 5 Mg Tabs (Cyclobenzaprine hcl) .Marland Kitchen... Take one by mouth three times a day as needed headaches Her updated medication list for this problem includes:    Mobic 7.5 Mg Tabs (Meloxicam) ..... One to two tabs by mouth daily for pain.  Orders: [facility]- Est  Level  4 ([address]) Diagnostic X-Ray/Fluoroscopy (Diagnostic X-Ray/Flu)  Complete Medication List: 1)  Neurontin 300 Mg Caps (Gabapentin) .... One by mouth at bedtime, then one by mouth two times a day, the one by mouth three times a day. incr as needed 2)  Mobic 7.5 Mg Tabs (Meloxicam) .... One to two tabs by mouth daily for pain.  Patient Instructions: 1)  Get your Xrays ASAP. 2)  Neurontin as below, may increase daily up to a max of 3,600 mg a day. 3)  Mobic as directed. 4)  Make appt to come back to see me in 1-2 weeks. 5)  -Dr. Karie Schwalbe. Prescriptions: MOBIC 7.5 MG TABS (MELOXICAM) One to two tabs by mouth daily for pain.  #30 x 3   Entered and Authorized by:   Rodney Langton  MD   Signed by:   Rodney Langton MD on 02/19/2010   Method used:   [facility] to        [facility] [phone]* (retail)       [address]       [place], [place]  [address]       Ph: [phone]       Fax: [phone]   RxID:   [id number] NEURONTIN 300 MG CAPS (GABAPENTIN) One by mouth at bedtime, then one by mouth two times a day, the one by mouth three times a day. Incr as needed  #90 x 6   Entered and Authorized by:   Rodney Langton MD   Signed by:   Rodney Langton MD on 02/19/2010   Method used:   Electronically to        [facility] [phone]* (retail)       [address]       [place], [place]  [address]       Ph: [phone]       Fax: [phone]   RxID:   [id number]

## 2010-06-23 NOTE — Progress Notes (Signed)
Summary: waiting for pt to call back/ts  Phone Note Call from Patient   Caller: Patient Call For: [phone] Summary of Call: Pt wanted to have a referrald to [facility] in [facility]  @ [phone] because she will be able to get in sooner than her appt at [facility]. Please call pt and let her know when referral has been made.   Initial call taken by: Abundio Miu,  March 23, 2010 1:52 PM  Follow-up for Phone Call        Renato Battles, pt would like referral to diff facility.  Can you send our referral and my OV note to this place?  Follow-up by: Angeline Slim MD,  March 23, 2010 3:32 PM  Additional Follow-up for Phone Call Additional follow up Details #1::        called pt and lmvm to call back. Please ask pt, if she called them for appt? How soon can they take her? Did she call [facility]? Additional Follow-up by: Arlyss Repress CMA,,  March 23, 2010 5:20 PM    Additional Follow-up for Phone Call Additional follow up Details #2::    faxexd referral to [facility] in [facility]. Appt is 03/31/10 @ 10:30am. Called and notified pt of appt and to call them about payment as there is a problem with her medicaid and was scheduled as a self pay pt and would need to bring $230 to visit. Pt says she understands and will call about the insurance and payment. Follow-up by: Tessie Fass CMA,  March 24, 2010 11:02 AM

## 2010-06-28 ENCOUNTER — Emergency Department (INDEPENDENT_AMBULATORY_CARE_PROVIDER_SITE_OTHER): Payer: Medicaid Other

## 2010-06-28 ENCOUNTER — Emergency Department (HOSPITAL_BASED_OUTPATIENT_CLINIC_OR_DEPARTMENT_OTHER)
Admission: EM | Admit: 2010-06-28 | Discharge: 2010-06-28 | Disposition: A | Discharge status: Home / Self Care | Payer: Medicaid Other | Attending: Emergency Medicine | Admitting: Emergency Medicine

## 2010-06-28 DIAGNOSIS — Y9289 Other specified places as the place of occurrence of the external cause: Secondary | ICD-10-CM | POA: Insufficient documentation

## 2010-06-28 DIAGNOSIS — E669 Obesity, unspecified: Secondary | ICD-10-CM | POA: Insufficient documentation

## 2010-06-28 DIAGNOSIS — S93409A Sprain of unspecified ligament of unspecified ankle, initial encounter: Secondary | ICD-10-CM

## 2010-06-28 DIAGNOSIS — X500XXA Overexertion from strenuous movement or load, initial encounter: Secondary | ICD-10-CM | POA: Insufficient documentation

## 2010-06-28 DIAGNOSIS — M7989 Other specified soft tissue disorders: Secondary | ICD-10-CM

## 2010-06-28 DIAGNOSIS — F17200 Nicotine dependence, unspecified, uncomplicated: Secondary | ICD-10-CM | POA: Insufficient documentation

## 2010-08-05 ENCOUNTER — Ambulatory Visit (INDEPENDENT_AMBULATORY_CARE_PROVIDER_SITE_OTHER): Payer: Medicaid Other | Admitting: Family Medicine

## 2010-08-05 ENCOUNTER — Encounter: Payer: Self-pay | Admitting: Family Medicine

## 2010-08-05 VITALS — BP 112/74 | Temp 98.2°F | Ht 65.5 in | Wt 249.0 lb

## 2010-08-05 DIAGNOSIS — S83429A Sprain of lateral collateral ligament of unspecified knee, initial encounter: Secondary | ICD-10-CM

## 2010-08-05 DIAGNOSIS — M25561 Pain in right knee: Secondary | ICD-10-CM

## 2010-08-05 DIAGNOSIS — M25569 Pain in unspecified knee: Secondary | ICD-10-CM

## 2010-08-05 LAB — URINE MICROSCOPIC-ADD ON

## 2010-08-05 LAB — URINALYSIS, ROUTINE W REFLEX MICROSCOPIC
Bilirubin Urine: NEGATIVE
Glucose, UA: NEGATIVE mg/dL
Ketones, ur: 15 mg/dL — AB
Nitrite: POSITIVE — AB
Specific Gravity, Urine: 1.022 (ref 1.005–1.030)
pH: 5.5 (ref 5.0–8.0)

## 2010-08-05 LAB — CBC
Hemoglobin: 13.4 g/dL (ref 12.0–15.0)
MCH: 34.3 pg — ABNORMAL HIGH (ref 26.0–34.0)
MCHC: 33.6 g/dL (ref 30.0–36.0)
Platelets: 315 10*3/uL (ref 150–400)
RBC: 3.91 MIL/uL (ref 3.87–5.11)

## 2010-08-05 LAB — DIFFERENTIAL
Basophils Relative: 4 % — ABNORMAL HIGH (ref 0–1)
Eosinophils Absolute: 0 10*3/uL (ref 0.0–0.7)
Neutro Abs: 3.7 10*3/uL (ref 1.7–7.7)
Neutrophils Relative %: 53 % (ref 43–77)

## 2010-08-05 LAB — PREGNANCY, URINE: Preg Test, Ur: NEGATIVE

## 2010-08-05 MED ORDER — SCD SOFT SLEEVES/KNEE LENGTH MISC: 1.0000 | Status: DC

## 2010-08-05 NOTE — Patient Instructions (Signed)
Thank you for coming in today. I think you have a partial tear of your LCL and possibly the meniscus.  Do the straight leg raises 15 reps at a time 3 times a day.  Do leg extensions 15 reps 3 times a day.  Heat is fine.  Get the knee sleeve that we talked about.  If this gets worse call me and I will refer you to the sports medicine clinic.

## 2010-08-10 DIAGNOSIS — S83429A Sprain of lateral collateral ligament of unspecified knee, initial encounter: Secondary | ICD-10-CM | POA: Insufficient documentation

## 2010-08-10 NOTE — Progress Notes (Signed)
Carla Walters presents to clinic today with right knee giving out starting a few weeks ago. She had an episode where she stumbled and collapsed. At that time she sprained her right ankle. She denied any knee pain at that time. She noted a small amount of knee swelling. She continues to walk however she will have episodes where her knee will "give out" with no warning. She has almost fallen a few times. She states that her knee does not hurt but only is unstable. No locking or catching.   ROS: As above.   Exam:  Vs noted.  Gen: Well NAD obese MSK: Right knee is obese with a small amount of effusion when compared to her left knee. Mild TTP over the lateral joint line. Lachmans neg, Mumurey's negative.  Medial stress neg, Lateral stress slightly opens with a small amount of pain. Anterior drawer is neg.   Ultrasound exam shows a small amount of fluid in the suprapatellar pouch. Normal MCL and meniscus.  LCL has a small collections of fluid underlying it. Perhaps some irregularity of the ligament itself. However it appears to be intact.

## 2010-08-10 NOTE — Assessment & Plan Note (Signed)
Think Ms Rundle has a partial LCL tear with perhaps a lateral meniscus tear as well. She does not have pain.  Plan: Knee sleeve + quad strength exercises and follow up if not improving. If worse would plan for referral to sports medicine clinic for comprehensive diagnostic ultrasound vs MRI.

## 2010-09-03 LAB — DIFFERENTIAL
Basophils Absolute: 0.1 10*3/uL (ref 0.0–0.1)
Eosinophils Absolute: 0.1 10*3/uL (ref 0.0–0.7)
Lymphocytes Relative: 49 % — ABNORMAL HIGH (ref 12–46)
Lymphs Abs: 3.7 10*3/uL (ref 0.7–4.0)

## 2010-09-03 LAB — COMPREHENSIVE METABOLIC PANEL
AST: 17 U/L (ref 0–37)
Albumin: 3.6 g/dL (ref 3.5–5.2)
Alkaline Phosphatase: 63 U/L (ref 39–117)
BUN: 5 mg/dL — ABNORMAL LOW (ref 6–23)
GFR calc Af Amer: >60 mL/min (ref >60)
Potassium: 3.8 mEq/L (ref 3.5–5.1)
Sodium: 136 mEq/L (ref 135–145)
Total Protein: 6.4 g/dL (ref 6.0–8.3)

## 2010-09-03 LAB — CBC
HCT: 42.6 % (ref 36.0–46.0)
Platelets: 273 10*3/uL (ref 150–400)
RDW: 13.7 % (ref 11.5–15.5)
WBC: 7.8 10*3/uL (ref 4.0–10.5)

## 2010-09-03 LAB — URINALYSIS, ROUTINE W REFLEX MICROSCOPIC
Nitrite: NEGATIVE
Protein, ur: NEGATIVE mg/dL
Specific Gravity, Urine: 1.023 (ref 1.005–1.030)
Urobilinogen, UA: 1 mg/dL (ref 0.0–1.0)

## 2010-09-03 LAB — LIPASE, BLOOD: Lipase: 21 U/L (ref 11–59)

## 2010-09-03 LAB — POCT PREGNANCY, URINE: Preg Test, Ur: NEGATIVE

## 2010-10-09 NOTE — H&P (Signed)
[facility]  Patient:    Carla Walters, Carla Walters Visit Number: [id number] MRN: [id number]          Service Type: OBS Location: 910A [id number] [id number] Attending Physician:  Leonard Schwartz Dictated by:   Janine Limbo, M.D. Admit Date:  10/22/2001                           History and Physical  CHIEF COMPLAINT:              Ms. Verlon Au is a 35 year old female, gravida 7 para 2, 0-4-2, who presents to [facility] [place] in active labor.  HISTORY OF PRESENT ILLNESS:   She is currently [redacted] weeks pregnant and estimated date of delivery is November 02, 2001.  The patient has been followed in [place], [place] at [facility] by Dr. Ebony Cargo.  The patient was visiting family members here in [place], [place] when she spontaneously went into labor.  Her pregnancy has been largely uncomplicated. She did have a urinary tract infection during the pregnancy.  She also had a positive beta strep culture.  She does not desire permanent sterilization at this time.  OBSTETRICAL HISTORY:          1. In 1997 the patient had a vaginal delivery at                                  term where she delivered an 8 pound 6 ounce                                  female infant.                               2. In June 2002 the patient had a term vaginal                                  delivery of a 7 pound 7 ounce female infant.                               3. The patient has had three miscarriages in the                                  first trimester and one miscarriage in the                                  second trimester.  DRUG ALLERGIES:               None.  PAST MEDICAL HISTORY:         1. The patient has a history of clinical                                  depression.  2. She has had a tonsillectomy in the past                                  (age 84).                               3. Otherwise she is very  healthy.  CURRENT MEDICATIONS:          Prenatal vitamins only.  SOCIAL HISTORY:               The patient smokes three to four cigarettes each day.  She denies alcohol use and other recreational drug use.  REVIEW OF SYSTEMS:            Normal pregnancy complaints.  FAMILY HISTORY:               Noncontributory.  PHYSICAL EXAMINATION:  VITAL SIGNS:                  Weight is 268 pounds.  HEENT:                        Within normal limits except for a patient who is obviously in labor.  CHEST:                        Clear.  HEART:                        Regular rate and rhythm.  ABDOMEN:                      Gravid.  EXTREMITIES:                  Within normal limits.  NEUROLOGIC:                   Grossly normal.  PELVIC:                       The patient was 6 cm dilated in maternal admissions.  Her membranes were intact.  LABORATORY DATA:              Blood type is B-positive.  Antibody screen is negative.  VDRL was nonreactive.  HBsAg was negative.  Sickle cell is negative.  GC negative.  Chlamydia negative.  PPD is negative.  Toxoplasmosis titer is negative.  Glucola screen is within normal limits.  Alpha-fetoprotein was declined.                                Ultrasound showed an echogenic intracardiac focus.                                Third trimester beta strep is positive.  ASSESSMENT:                   1. Thirty-eight week gestation.                               2. Active labor.  3. Positive beta streptococci.                               4. Echogenic cardiac focus.  PLAN:                         We will plan a vaginal delivery.  The nursery will be notified of the patients laboratory values.Dictated by:   Janine Limbo, M.D. Attending Physician:  Leonard Schwartz DD:  10/22/01 TD:  10/23/01 Job: [phone] [id number]

## 2010-10-20 ENCOUNTER — Encounter: Payer: Self-pay | Admitting: Family Medicine

## 2010-10-20 ENCOUNTER — Ambulatory Visit (INDEPENDENT_AMBULATORY_CARE_PROVIDER_SITE_OTHER): Payer: Medicaid Other | Admitting: Family Medicine

## 2010-10-20 VITALS — BP 105/69 | HR 94 | Temp 98.1°F | Wt 254.0 lb

## 2010-10-20 DIAGNOSIS — M25539 Pain in unspecified wrist: Secondary | ICD-10-CM

## 2010-10-20 DIAGNOSIS — M25531 Pain in right wrist: Secondary | ICD-10-CM | POA: Insufficient documentation

## 2010-10-20 DIAGNOSIS — M25532 Pain in left wrist: Secondary | ICD-10-CM

## 2010-10-20 NOTE — Progress Notes (Signed)
  Subjective:    Patient ID: Carla Walters, female    DOB: Sep 29, 1975, 35 y.o.   MRN: [id number]  HPI  1) Left wrist / hand pain: Started 3:30 AM on 5/28 - woke her up from sleep. Reports sharp pain radiating from tip of third digit down to wrist, reports numbness and tingling in fingers (thumb to third digit) and palm of hand to wrist. Denies trauma, weakness, dropping items, fever, chills, weight loss, swelling, rash, bruising, neck pain, shoulder pain. 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 Has been previously seen for concern for carpal tunnel syndrome (with similar symptoms) by previous PCP in June 2011 - placed on wrist brace and NSAID (she did not wear the brace because "it got in the way of typing"). Has had neck films with concern for cervical radiculopathy - films were negative for disc or facet degeneration.    Pertinent past history reviewed   Review of Systems As per HPI     Objective:   Physical Exam General:  Well-developed,well-nourished,in no acute distress; alert,appropriate and cooperative throughout examination. vitals reviewed. Head:  Normocephalic and atraumatic without obvious abnormalities. No alopecia.  Eyes:  PERRL, EOMI  Neck:  supple and full ROM; Spurling negative; no masses  Msk: Left wrist: Full ROM without pain. No swelling or obvious abnormalities noted. Equal strength bilaterally with finger/thubm abduction, grip, wrist flexion/extension, thumb opposition. + Phalen, + manual carpal compression test, negative Tinel, negative Finkelstein right wrist - WNL.  FROM. Neurologic:  No cranial nerve deficits noted. Sensation intact completely in bilateral upper extremities with pinprick and light touch (Except with provocative maneuvers as above)        Assessment & Plan:

## 2010-10-20 NOTE — Assessment & Plan Note (Signed)
Exam consistent with carpal tunnel syndrome. Will have patient wear splint at night. Follow up 6 weeks. If symptoms worsening would consider more invasive options - steroid vs surgical management.

## 2010-10-20 NOTE — Patient Instructions (Signed)
    Carpal Tunnel Syndrome The carpal tunnel is a narrow hollow area in the wrist. It is formed by the wrist bones and ligaments. Nerves, blood vessels, and tendons (cord like structures which attach muscle to bone) on the palm side (the side of your hand in the direction your fingers bend) of your hand pass through the carpal tunnel. Repeated wrist motion or certain diseases may cause swelling within the tunnel. (That is why these are called repetitive trauma (damage caused by over use) disorders. It is also a common problem in late pregnancy.) This swelling pinches the main nerve in the wrist (median nerve) and causes the painful condition called carpal tunnel syndrome. A feeling of "pins and needles" may be noticed in the fingers or hand; however, the entire arm may ache from this condition. Carpal tunnel syndrome may clear up by itself. Cortisone injections may help. Sometimes, an operation may be needed to free the pinched nerve. An electromyogram (a type of test) may be needed to confirm this diagnosis (learning what is wrong). This is a test which measures nerve conduction. The nerve conduction is usually slowed in a carpal tunnel syndrome. HOME CARE INSTRUCTIONS  If your caregiver prescribed medication to help reduce swelling, take as directed.   If you were given a splint to keep your wrist from bending, use it as instructed. It is important to wear the splint at night. Use the splint for as long as you have pain or numbness in your hand, arm or wrist. This may take 1 to 2 months.   If you have pain at night, it may help to rub or shake your hand, or elevate your hand above the level of your heart (the center of your chest).   It is important to give your wrist a rest by stopping the activities that are causing the problem. If your symptoms (problems) are work-related, you may need to talk to your employer about changing to a job that does not require using your wrist.   Only take  over-the-counter or prescription medicines for pain, discomfort, or fever as directed by your caregiver.   Following periods of extended use, particularly strenuous use, apply an ice pack wrapped in a towel to the anterior (palm) side of the affected wrist for 20 to 30 minutes. Repeat as needed three to four times per day. This will help reduce the swelling.   Follow all instructions for follow-up with your caregiver. This includes any orthopedic referrals, physical therapy, and rehabilitation. Any delay in obtaining necessary care could result in a delay or failure of your condition to heal.  SEEK IMMEDIATE MEDICAL CARE IF:  You are still having pain and numbness following a week of treatment.   You develop new, unexplained symptoms.   Your current symptoms are getting worse and are not helped or controlled with medications.  MAKE SURE YOU:   Understand these instructions.   Will watch your condition.   Will get help right away if you are not doing well or get worse.  Document Released: 05/07/2000 Document Re-Released: 08/06/2008 [facility] Patient Information 2011 [place], [place].

## 2010-10-21 ENCOUNTER — Other Ambulatory Visit (HOSPITAL_COMMUNITY)
Admission: RE | Admit: 2010-10-21 | Discharge: 2010-10-21 | Disposition: A | Discharge status: Home / Self Care | Payer: Medicaid Other | Source: Ambulatory Visit | Attending: Family Medicine | Admitting: Family Medicine

## 2010-10-21 ENCOUNTER — Ambulatory Visit (INDEPENDENT_AMBULATORY_CARE_PROVIDER_SITE_OTHER): Payer: Medicaid Other | Admitting: Family Medicine

## 2010-10-21 ENCOUNTER — Encounter: Payer: Self-pay | Admitting: Family Medicine

## 2010-10-21 VITALS — BP 116/80 | HR 88 | Temp 98.2°F | Wt 256.0 lb

## 2010-10-21 DIAGNOSIS — Z01419 Encounter for gynecological examination (general) (routine) without abnormal findings: Secondary | ICD-10-CM | POA: Insufficient documentation

## 2010-10-21 DIAGNOSIS — N760 Acute vaginitis: Secondary | ICD-10-CM

## 2010-10-21 DIAGNOSIS — N898 Other specified noninflammatory disorders of vagina: Secondary | ICD-10-CM

## 2010-10-21 DIAGNOSIS — L659 Nonscarring hair loss, unspecified: Secondary | ICD-10-CM

## 2010-10-21 DIAGNOSIS — N83209 Unspecified ovarian cyst, unspecified side: Secondary | ICD-10-CM

## 2010-10-21 DIAGNOSIS — Z124 Encounter for screening for malignant neoplasm of cervix: Secondary | ICD-10-CM

## 2010-10-21 LAB — POCT WET PREP (WET MOUNT)
Trichomonas Wet Prep HPF POC: NEGATIVE
Yeast Wet Prep HPF POC: NEGATIVE

## 2010-10-21 MED ORDER — METRONIDAZOLE 500 MG PO TABS: 500.0000 mg | ORAL_TABLET | Freq: Two times a day (BID) | ORAL | Status: AC

## 2010-10-21 NOTE — Progress Notes (Signed)
Subjective:    Patient ID: Carla Walters, female    DOB: June 10, 1975, 35 y.o.   MRN: [id number]  HPI Pt is here for pap and is complaining of discharge, yellowish, no dysuria, hematuria, frequency.  Sexually active with 1 partner  X 4 yrs. Use condoms 100%.   OB history: [id number]:  All three vaginal deliveries.  Births normal.  First pregnancy was complicated by syncope episodes so was on bed rest.  2nd pregancy had be best rest fully.  3rd pregnancy was normal.   1: 1996-1997: son 2: 1997: 1st trimester SAB 3: 1998: 1st trimester SAB 4: 1999: 1st trimester SAB 5: 2001: 1st trimester SAB 6: 2002: son 7: 2003: daughter  06/3002: BTL  GYN: history of abnormal paps in the past with workup including culposcopy and endometrial bx.  She has had regular paps that have been normal for the past 3 yrs.   Hair loss: Pt states that she has had hair loss for years and was seen by dermatologist years ago, who prescribed a cream for her.  She recently shaved her head bald because she was tired of spotty areas of hair loss. States that she has not used any chemicals on her head that should lead to hair loss.   Family History: -gma with CVD and CAD, HTN, DM, - multiple relatives w/ fibroids requiring TAH,  -PGM passed from ovarian? or cervical? cancer at age 77s -No colon ca -sister, age 18, had partial hysterectomy d/t fibroids -cousin (summer 2011): died from ovarian cyst rupture  Social History: Divorced 2009.  Boyfriend Jimmy. Sexually active. Using condoms. Cutting down on smoking ~1/2 ppd (used to smoke up to 2 ppds, started when she was 35 y/o).   Social alcohol.  No rec drugs.   Living with 3 kids (13, 8, 7).   [employer] full time. Plans to graduate 04/2011 with BS in Psychology.  Risk Factors: Alcohol Use: <1  Diet: Needs to eat more healthy  Past Medical History: -[id number] -Chlamydia 2/05 -H/o abnl pap s/p cryotx, subsequent nl pap,  -R ATF sprain 4/05 -Trich  3/10.  Past Surgical History: -T & A - 1996 - D&Cs after each SAB -BTL - 06/24/2002: [facility] -Pelvic US- prob. fibroid dz, nl ovaries - 08/22/2003,    Review of Systems  Constitutional: Negative for fever and chills.  HENT: Negative.   Respiratory: Negative for cough, shortness of breath and wheezing.   Cardiovascular: Negative for chest pain and leg swelling.  Gastrointestinal: Negative for nausea, vomiting, abdominal pain, diarrhea and constipation.  Genitourinary: Negative for dysuria, hematuria, vaginal bleeding, vaginal discharge, vaginal pain and dyspareunia.  Skin: Negative for rash.  Neurological: Negative for dizziness, seizures and weakness.  Hematological: Negative for adenopathy.       Objective:   Physical Exam  Constitutional: She is oriented to person, place, and time. She appears well-developed and well-nourished. No distress.  HENT:  Head: Normocephalic and atraumatic.       Head: shaved bald. Areas of hair growth and areas without air growth. No redness. No erythema.  Some area of flaky dry skin.   Eyes: EOM are normal. Pupils are equal, round, and reactive to light. No scleral icterus.  Neck: Normal range of motion. Neck supple. No thyromegaly present.  Cardiovascular: Normal rate, regular rhythm, normal heart sounds and intact distal pulses.   No murmur heard. Pulmonary/Chest: Effort normal and breath sounds normal. No respiratory distress. She has no wheezes.  Abdominal: Soft. Bowel sounds are normal. She  exhibits no distension and no mass. There is no tenderness.  Genitourinary: There is no rash or lesion on the right labia. There is no rash or lesion on the left labia. Cervix exhibits no motion tenderness. Right adnexum displays no mass and no tenderness. Left adnexum displays no mass and no tenderness. Vaginal discharge found.  Musculoskeletal: Normal range of motion. She exhibits no edema and no tenderness.  Lymphadenopathy:    She has no cervical adenopathy.        Right: No inguinal adenopathy present.       Left: No inguinal adenopathy present.  Neurological: She is alert and oriented to person, place, and time. Coordination normal.  Skin: Skin is warm. No rash noted. No erythema.  Psychiatric: She has a normal mood and affect.          Assessment & Plan:

## 2010-10-22 ENCOUNTER — Encounter: Payer: Self-pay | Admitting: Family Medicine

## 2010-10-22 DIAGNOSIS — L659 Nonscarring hair loss, unspecified: Secondary | ICD-10-CM | POA: Insufficient documentation

## 2010-10-22 LAB — CBC
HCT: 37.3 % (ref 36.0–46.0)
MCHC: 33.8 g/dL (ref 30.0–36.0)
RDW: 15.6 % — ABNORMAL HIGH (ref 11.5–15.5)
WBC: 7.9 10*3/uL (ref 4.0–10.5)

## 2010-10-22 LAB — BASIC METABOLIC PANEL
BUN: 7 mg/dL (ref 6–23)
Chloride: 109 mEq/L (ref 96–112)
Potassium: 4.2 mEq/L (ref 3.5–5.3)
Sodium: 139 mEq/L (ref 135–145)

## 2010-10-22 NOTE — Assessment & Plan Note (Signed)
Patchy hair loss x years.  Pt was seen by derm in the past, but years ago. Pt requesting referral to dermatologist again.

## 2010-10-22 NOTE — Assessment & Plan Note (Signed)
She was seen in ER on 03/12/10 for abd pain.  CT at that time showed no appendicitis, diverticulosis without diverticulitis, R ovarin cyst about 2cm.  States that her grandmother passed from ovarian cancer.  She has multiple family members that required TAH for fibroids before age 35.  She endorses that recently (this summer) her cousin passed away after an unknown ovarian cyst ruptured.  Because of her family history she is very concerned and desires TAH.  Pt asked for referral to GYN, which we made appt for pt to be seen at [facility] 03/17/10.  Pt then asked for referral to [facility] in [facility], which we did and her appt was on 03/31/10.

## 2010-10-22 NOTE — Progress Notes (Signed)
Addended by: Wade Sigala P on: 10/22/2010 09:43 PM   Modules accepted: Orders

## 2010-10-22 NOTE — Assessment & Plan Note (Signed)
Wet prep showed clue cells.  Tx with flagyl 500mg  bid x 7days.

## 2010-10-23 ENCOUNTER — Encounter: Payer: Self-pay | Admitting: Family Medicine

## 2010-10-27 ENCOUNTER — Telehealth: Payer: Self-pay | Admitting: *Deleted

## 2010-10-27 NOTE — Telephone Encounter (Signed)
Mailed letter to patient informing of appointment information.Carla Walters, Carla Walters

## 2010-12-02 ENCOUNTER — Emergency Department (HOSPITAL_BASED_OUTPATIENT_CLINIC_OR_DEPARTMENT_OTHER)
Admission: EM | Admit: 2010-12-02 | Discharge: 2010-12-03 | Disposition: A | Discharge status: Home / Self Care | Payer: Self-pay | Attending: Emergency Medicine | Admitting: Emergency Medicine

## 2010-12-02 ENCOUNTER — Emergency Department (INDEPENDENT_AMBULATORY_CARE_PROVIDER_SITE_OTHER): Payer: Self-pay

## 2010-12-02 DIAGNOSIS — R109 Unspecified abdominal pain: Secondary | ICD-10-CM

## 2010-12-02 DIAGNOSIS — R110 Nausea: Secondary | ICD-10-CM | POA: Insufficient documentation

## 2010-12-02 DIAGNOSIS — R1084 Generalized abdominal pain: Secondary | ICD-10-CM | POA: Insufficient documentation

## 2010-12-02 DIAGNOSIS — R1031 Right lower quadrant pain: Secondary | ICD-10-CM | POA: Insufficient documentation

## 2010-12-02 LAB — DIFFERENTIAL
Basophils Relative: 1 % (ref 0–1)
Monocytes Relative: 9 % (ref 3–12)
Neutro Abs: 4.1 10*3/uL (ref 1.7–7.7)
Neutrophils Relative %: 55 % (ref 43–77)

## 2010-12-02 LAB — COMPREHENSIVE METABOLIC PANEL
ALT: 11 U/L (ref 0–35)
AST: 17 U/L (ref 0–37)
Albumin: 3.6 g/dL (ref 3.5–5.2)
Alkaline Phosphatase: 84 U/L (ref 39–117)
BUN: 8 mg/dL (ref 6–23)
Chloride: 102 mEq/L (ref 96–112)
Potassium: 4.9 mEq/L (ref 3.5–5.1)
Sodium: 137 mEq/L (ref 135–145)
Total Bilirubin: 0.2 mg/dL — ABNORMAL LOW (ref 0.3–1.2)

## 2010-12-02 LAB — CBC
Hemoglobin: 12.8 g/dL (ref 12.0–15.0)
MCHC: 34.6 g/dL (ref 30.0–36.0)
RBC: 4.07 MIL/uL (ref 3.87–5.11)

## 2010-12-02 LAB — URINALYSIS, ROUTINE W REFLEX MICROSCOPIC
Ketones, ur: 15 mg/dL — AB
Leukocytes, UA: NEGATIVE
Nitrite: NEGATIVE
Protein, ur: NEGATIVE mg/dL
Urobilinogen, UA: 1 mg/dL (ref 0.0–1.0)

## 2010-12-02 LAB — PREGNANCY, URINE: Preg Test, Ur: NEGATIVE

## 2010-12-02 MED ORDER — KETOROLAC TROMETHAMINE 30 MG/ML IJ SOLN
30.0000 mg | Freq: Once | INTRAMUSCULAR | Status: AC
  Administered 2010-12-02: 30 mg via INTRAVENOUS
  Filled 2010-12-02: qty 1

## 2010-12-02 MED ORDER — SODIUM CHLORIDE 0.9 % IV SOLN
INTRAVENOUS | Status: DC
  Administered 2010-12-02: 22:00:00 via INTRAVENOUS

## 2010-12-02 MED ORDER — SODIUM CHLORIDE 0.9 % IV SOLN: 20.0000 mL | INTRAVENOUS | Status: DC

## 2010-12-02 MED ORDER — OXYCODONE-ACETAMINOPHEN 5-325 MG PO TABS: 2.0000 | ORAL_TABLET | ORAL | Status: AC | PRN

## 2010-12-02 NOTE — ED Notes (Signed)
Pt c/o RLA abd pain radiating to right groin area

## 2010-12-02 NOTE — ED Provider Notes (Signed)
History     Chief Complaint  Patient presents with  . Abdominal Pain  . Nausea   Patient is a 35 y.o. female presenting with abdominal pain. The history is provided by the patient.  Abdominal Pain The primary symptoms of the illness include abdominal pain. The primary symptoms of the illness do not include fever, fatigue, nausea, vomiting, diarrhea, hematemesis, dysuria, vaginal discharge or vaginal bleeding. The current episode started 1 to 2 hours ago. The onset of the illness was sudden. The problem has not changed since onset. The abdominal pain is located in the right flank. The abdominal pain radiates to the right leg and RLQ. The abdominal pain is relieved by certain positions. The abdominal pain is exacerbated by movement.  The patient states that she believes she is currently not pregnant. The patient has not had a change in bowel habit. Additional symptoms associated with the illness include back pain. Symptoms associated with the illness do not include chills, heartburn, hematuria or frequency.    Past Medical History  Diagnosis Date  . Carpal tunnel syndrome of left wrist     Wears splint at night  . Ovarian cyst, right   . Lumbar radiculopathy, chronic   . Cervical radiculopathy   . Obesity   . Ovarian cyst   . Tobacco user     Since age 50    Past Surgical History  Procedure Date  . Tubal ligation 06/2002  . Tonsilectomy, adenoidectomy, bilateral myringotomy and tubes     Family History  Problem Relation Age of Onset  . Fibroids Sister   . Heart disease Maternal Grandfather   . Hypertension Maternal Grandfather   . Diabetes Maternal Grandfather   . Cancer Paternal Grandmother     History  Substance Use Topics  . Smoking status: Current Everyday Smoker -- 1.0 packs/day    Types: Cigarettes  . Smokeless tobacco: Never Used   Comment: Smoked since age 21  . Alcohol Use: Yes    OB History    Grav Para Term Preterm Abortions TAB SAB Ect Mult Living   7 3  3  0 4 0 4 0 0 3     Obstetric Comments   [id number]:  First pregnancy was complicated by syncope.  6th pregancy had be best rest.  7th pregnancy was normal.   1: 1996-1997: son 2: 1997: 1st SAB 3: 1998: 1st SAB 4: 1999: 1st SAB 5: 2001: 1st SAB 6: 2002: son 7: 2003: daughter      Review of Systems  Constitutional: Negative.  Negative for fever, chills and fatigue.  Gastrointestinal: Positive for abdominal pain. Negative for heartburn, nausea, vomiting, diarrhea and hematemesis.  Genitourinary: Negative for dysuria, frequency, hematuria, vaginal bleeding and vaginal discharge.  Musculoskeletal: Positive for back pain.    Physical Exam  BP 116/88  Pulse 91  Temp(Src) 98.4 F (36.9 C) (Oral)  Resp 18  SpO2 99%  LMP 11/22/2010  Physical Exam  Nursing note and vitals reviewed. Constitutional: She is oriented to person, place, and time. Vital signs are normal. She appears well-developed and well-nourished.  Non-toxic appearance. No distress.  HENT:  Head: Normocephalic and atraumatic.  Eyes: Conjunctivae and EOM are normal. Pupils are equal, round, and reactive to light.  Neck: Normal range of motion. Neck supple. No tracheal deviation present.  Cardiovascular: Normal rate, regular rhythm and normal heart sounds.  Exam reveals no gallop.   No murmur heard. Pulmonary/Chest: Effort normal and breath sounds normal. No stridor. No respiratory distress. She  has no wheezes.  Abdominal: Soft. Normal appearance and bowel sounds are normal. She exhibits no distension. There is tenderness in the right lower quadrant. There is no rigidity, no rebound, no guarding, no CVA tenderness and negative Murphy's sign. No hernia.  Musculoskeletal: Normal range of motion. She exhibits no edema and no tenderness.  Neurological: She is alert and oriented to person, place, and time. She has normal strength. No cranial nerve deficit or sensory deficit. GCS eye subscore is 4. GCS verbal subscore is 5. GCS  motor subscore is 6.  Skin: Skin is warm and dry.  Psychiatric: She has a normal mood and affect. Her speech is normal and behavior is normal.    ED Course  Procedures  MDM   Pt given pain meds and feels better--repeat abd exam nl--will d/c      Toy Baker, MD 12/02/10 2316

## 2010-12-06 ENCOUNTER — Encounter (HOSPITAL_BASED_OUTPATIENT_CLINIC_OR_DEPARTMENT_OTHER): Payer: Self-pay | Admitting: *Deleted

## 2011-09-12 IMAGING — CT CT ABD-PELV W/O CM
2 of 4 series · 17 of 46 positions shown, 19 images · non-contrast
Comparison: 03/12/2010

CLINICAL DATA: Right-sided abdominal pain and nausea

CT ABDOMEN AND PELVIS WITHOUT CONTRAST
TECHNIQUE: Multidetector CT imaging of the abdomen and pelvis was
performed following the standard protocol without intravenous
contrast.

[Series 2: renal stone < 200 lbs 5.0 b31f · axial · 0.89mm/px · z∈[-158,+238]mm · 14 of 87 slices shown, 16 images]
[im 4/87  soft-tissue]
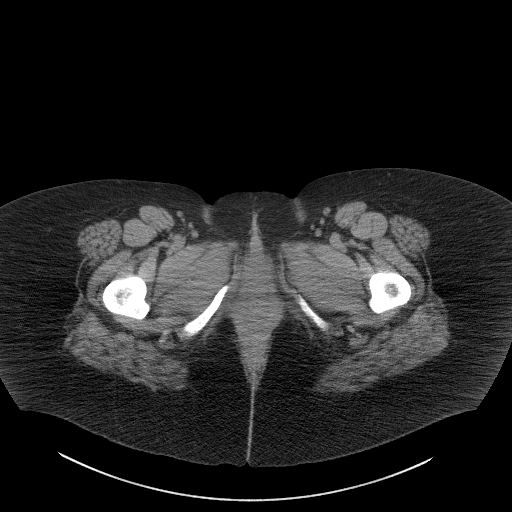
[im 4/87  bone]
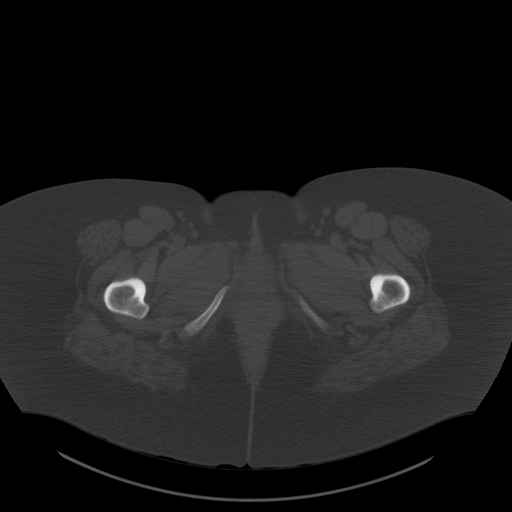
[im 11/87  soft-tissue]
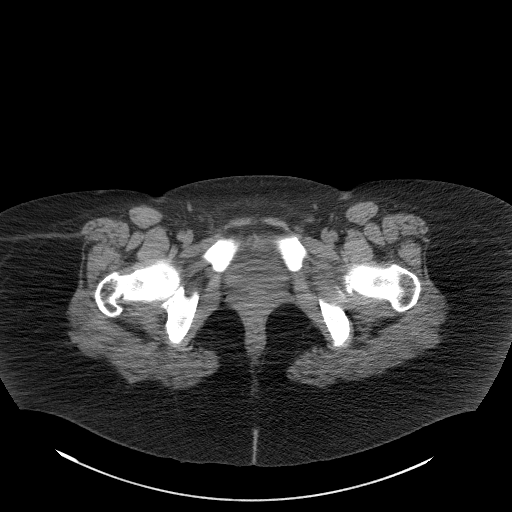
[im 18/87  soft-tissue]
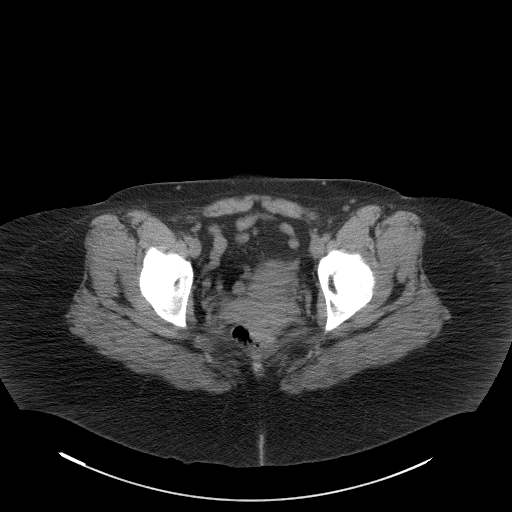
[im 22/87  soft-tissue]
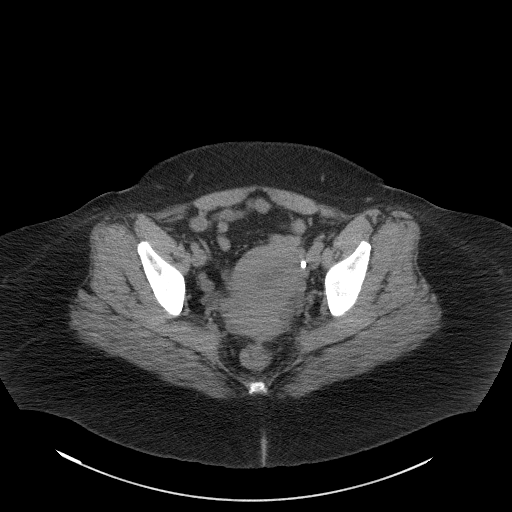
[im 29/87  soft-tissue]
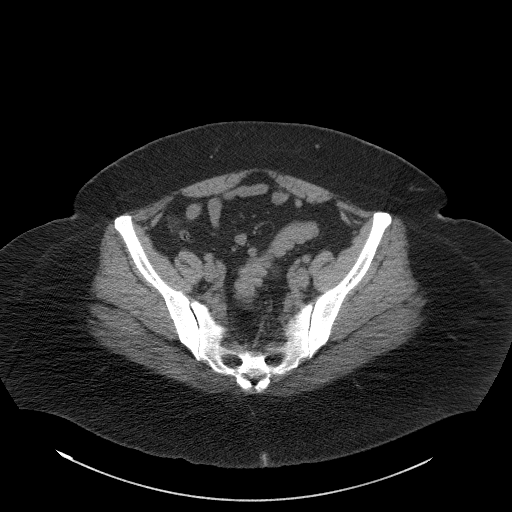
[im 36/87  soft-tissue]
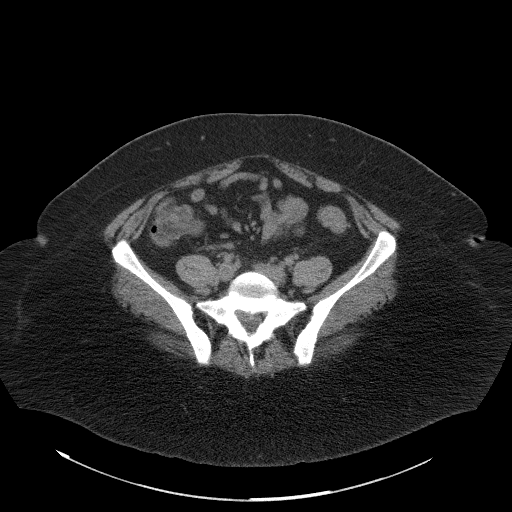
[im 40/87  soft-tissue]
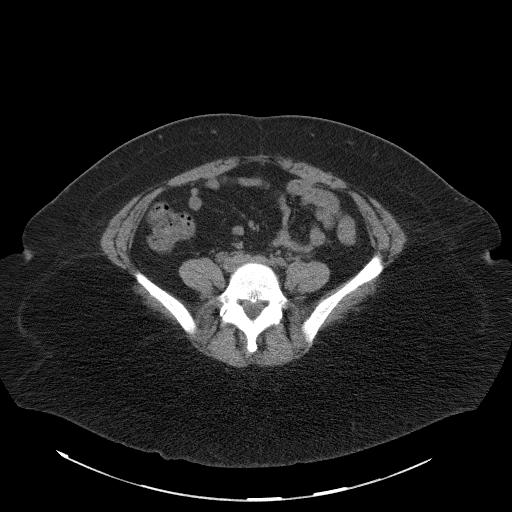
[im 47/87  soft-tissue]
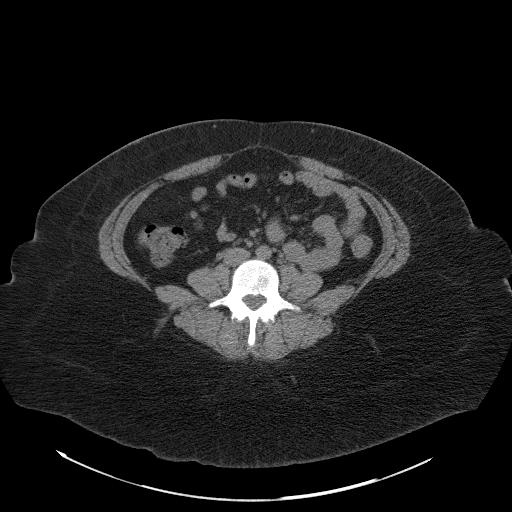
[im 51/87  soft-tissue]
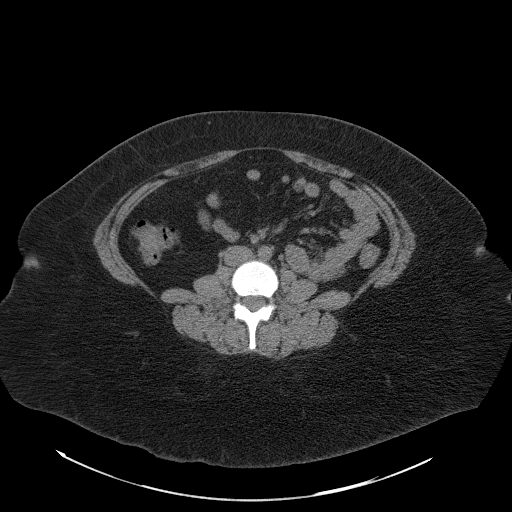
[im 51/87  bone]
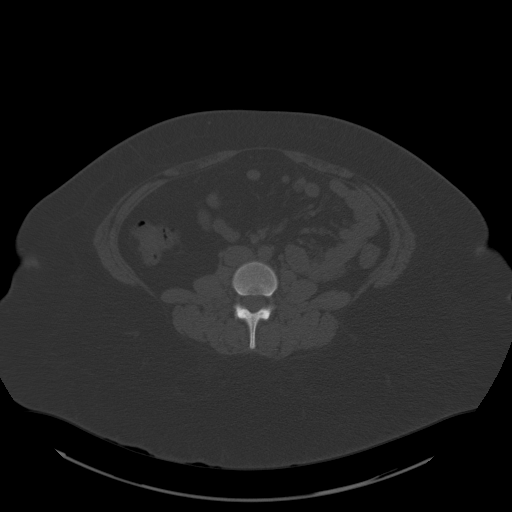
[im 58/87  soft-tissue]
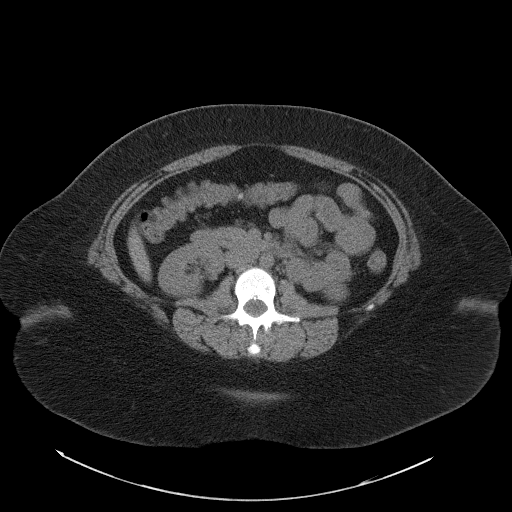
[im 65/87  soft-tissue]
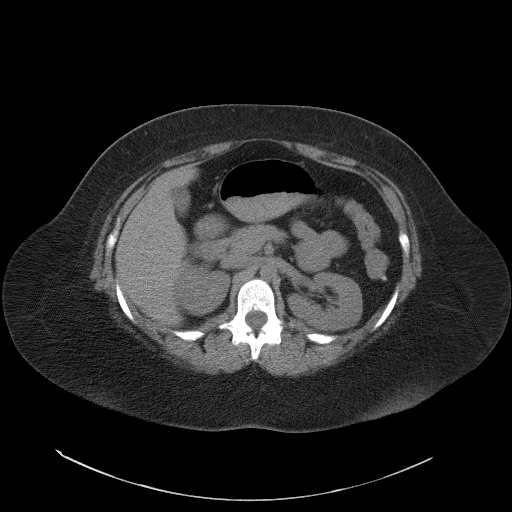
[im 69/87  soft-tissue]
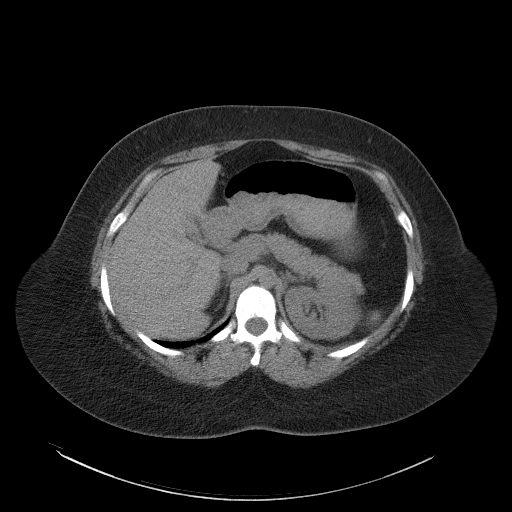
[im 76/87  soft-tissue]
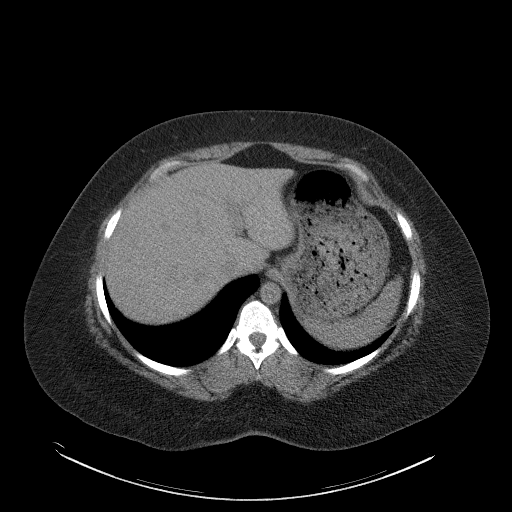
[im 83/87  soft-tissue]
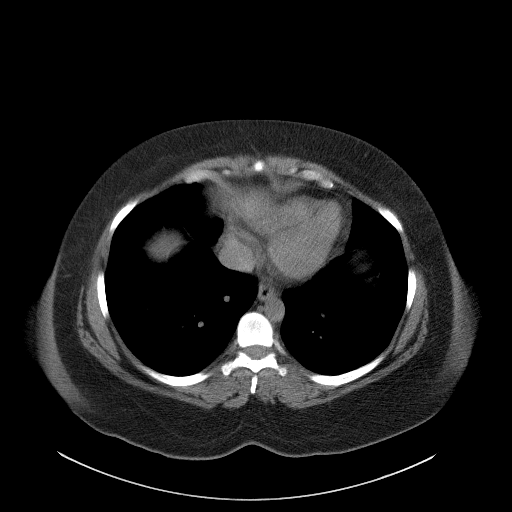

[Series 5: renal stone 3.0 coronal · coronal · 0.67mm/px · 3 of 92 slices shown]
[im 31/92  soft-tissue]
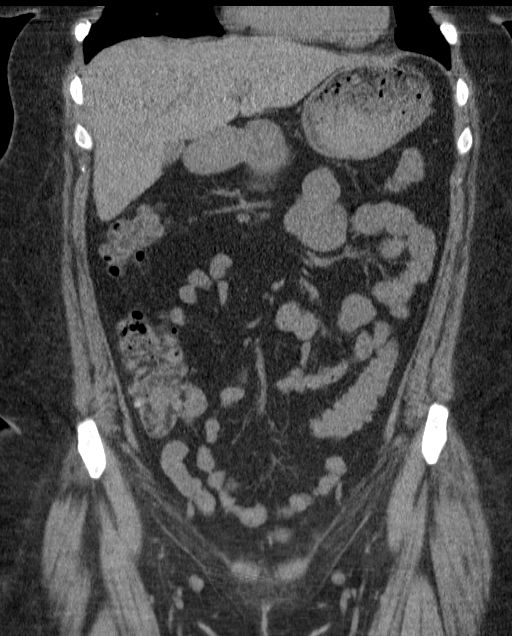
[im 41/92  soft-tissue]
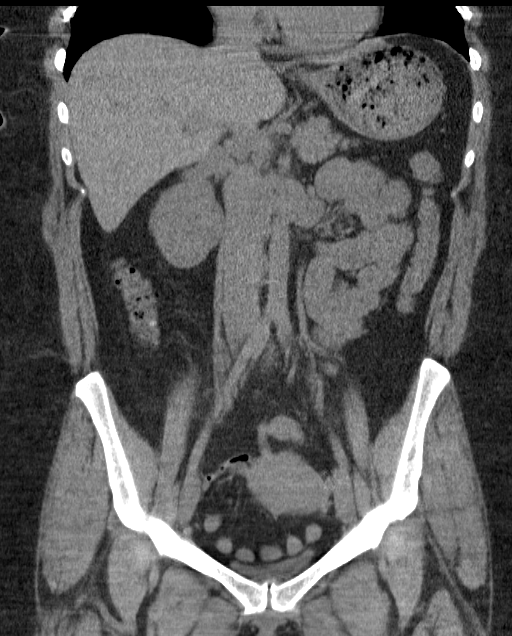
[im 51/92  soft-tissue]
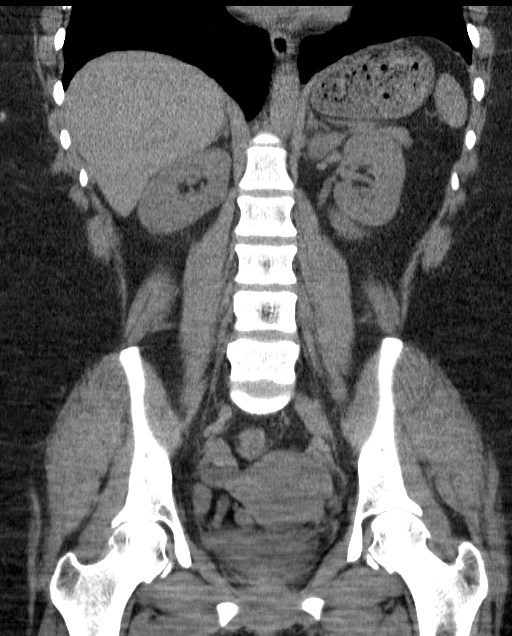

[17 of 46 positions shown; findings below may reference images not displayed]

FINDINGS: Lung bases are clear.  Unenhanced visualization of the
liver, spleen, gallbladder, pancreas, adrenal glands, kidneys,
abdominal aorta, retroperitoneal lymph nodes, and stomach are
unremarkable.  The small bowel is decompressed.  No free air or
free fluid in the abdomen. Small accessory spleen.

Pelvis:  The appendix is normal.  The uterus is anteverted without
enlargement.  No abnormal adnexal masses.  Surgical clips
consistent with tubal ligations.  No free or loculated pelvic fluid
collections.  The bladder is decompressed and cannot be evaluated.
Normal alignment of the lumbar vertebra.
IMPRESSION: No renal or ureteral stone or obstruction.  No acute inflammatory
process demonstrated in the abdomen or pelvis.

## 2011-10-13 ENCOUNTER — Emergency Department (HOSPITAL_BASED_OUTPATIENT_CLINIC_OR_DEPARTMENT_OTHER): Payer: Medicaid Other

## 2011-10-13 ENCOUNTER — Emergency Department (HOSPITAL_BASED_OUTPATIENT_CLINIC_OR_DEPARTMENT_OTHER)
Admission: EM | Admit: 2011-10-13 | Discharge: 2011-10-13 | Disposition: A | Discharge status: Home / Self Care | Payer: Medicaid Other | Attending: Emergency Medicine | Admitting: Emergency Medicine

## 2011-10-13 ENCOUNTER — Encounter (HOSPITAL_BASED_OUTPATIENT_CLINIC_OR_DEPARTMENT_OTHER): Payer: Self-pay

## 2011-10-13 DIAGNOSIS — S93409A Sprain of unspecified ligament of unspecified ankle, initial encounter: Secondary | ICD-10-CM | POA: Insufficient documentation

## 2011-10-13 DIAGNOSIS — IMO0002 Reserved for concepts with insufficient information to code with codable children: Secondary | ICD-10-CM | POA: Insufficient documentation

## 2011-10-13 DIAGNOSIS — W010XXA Fall on same level from slipping, tripping and stumbling without subsequent striking against object, initial encounter: Secondary | ICD-10-CM | POA: Insufficient documentation

## 2011-10-13 MED ORDER — IBUPROFEN 800 MG PO TABS: 800.0000 mg | ORAL_TABLET | Freq: Three times a day (TID) | ORAL | Status: AC

## 2011-10-13 NOTE — Discharge Instructions (Signed)
Return to the ED with any concerns including increased pain, swelling/discoloration/numbness of foot or toes, or any other alarming symptoms °

## 2011-10-13 NOTE — ED Notes (Signed)
Pt c/o left ankle pain started approx 1 hour PTA-states slid approx 10am-denies injury/pain at that time

## 2011-10-13 NOTE — ED Provider Notes (Signed)
History     CSN: [id number]  Arrival date & time 10/13/11  1520   First MD Initiated Contact with Patient 10/13/11 1531      Chief Complaint  Patient presents with  . Ankle Pain    (Consider location/radiation/quality/duration/timing/severity/associated sxs/prior treatment) HPI Pt presents with c/o left ankle pain.  She states she was wearing a new pair of wedge sandals and slipped earlier today, did not initially have any pain but sevearl hours later developed pain and swelling.  Pain worse with weight bearing, but able to ambualate with limp.  No other areas of pain.  Pt took ibuprofen on way to the ED.  Movement and palpation make pain worse, there are no other alleviating or modifying factors, there are no associated systemic symptoms.    Past Medical History  Diagnosis Date  . Carpal tunnel syndrome of left wrist     Wears splint at night  . Ovarian cyst, right   . Lumbar radiculopathy, chronic   . Cervical radiculopathy   . Obesity   . Ovarian cyst   . Tobacco user     Since age 82    Past Surgical History  Procedure Date  . Tubal ligation 06/2002  . Tonsilectomy, adenoidectomy, bilateral myringotomy and tubes     Family History  Problem Relation Age of Onset  . Fibroids Sister   . Heart disease Maternal Grandfather   . Hypertension Maternal Grandfather   . Diabetes Maternal Grandfather   . Cancer Paternal Grandmother     History  Substance Use Topics  . Smoking status: Current Everyday Smoker -- 1.0 packs/day    Types: Cigarettes  . Smokeless tobacco: Never Used   Comment: Smoked since age 20  . Alcohol Use: Yes    OB History    Grav Para Term Preterm Abortions TAB SAB Ect Mult Living   7 3 3  0 4 0 4 0 0 3     Obstetric Comments   [id number]:  First pregnancy was complicated by syncope.  6th pregancy had be best rest.  7th pregnancy was normal.   1: 1996-1997: son 2: 1997: 1st SAB 3: 1998: 1st SAB 4: 1999: 1st SAB 5: 2001: 1st SAB 6: 2002: son 7:  2003: daughter      Review of Systems ROS reviewed and all otherwise negative except for mentioned in HPI  Allergies  Sulfonamide derivatives  Home Medications   Current Outpatient Rx  Name Route Sig Dispense Refill  . IBUPROFEN 200 MG PO TABS Oral Take 200 mg by mouth every 6 (six) hours as needed. Patient took this medication for ankle pain.    . IBUPROFEN 200 MG PO TABS Oral Take 400 mg by mouth as needed. For pain     . IBUPROFEN 800 MG PO TABS Oral Take 1 tablet (800 mg total) by mouth 3 (three) times daily. 21 tablet 0  . MELOXICAM 7.5 MG PO TABS  7.5 mg. One to  Two tabs by mouth daily for pain       BP 122/85  Pulse 85  Temp(Src) 98.4 F (36.9 C) (Oral)  Resp 20  Ht 5\' 3"  (1.6 m)  Wt 257 lb (116.574 kg)  BMI 45.53 kg/m2  SpO2 100%  LMP 10/06/2011 Vitals reviewed Physical Exam Physical Examination: General appearance - alert, well appearing, and in no distress Mental status - alert, oriented to person, place, and time Neck - supple, no significant adenopathy Chest - clear to auscultation, no wheezes, rales or rhonchi,  symmetric air entry Heart - normal rate, regular rhythm, normal S1, S2, no murmurs, rubs, clicks or gallops Musculoskeletal - left ankle with ttp over lateral malleolus and mild soft tissue swelling, no deformity, otherwise no joint tenderness, deformity or swelling Extremities - peripheral pulses normal, no pedal edema, no clubbing or cyanosis Skin - normal coloration and turgor, no rashes, brisk cap refill  ED Course  Procedures (including critical care time)  Labs Reviewed - No data to display Dg Ankle Complete Left  10/13/2011  *RADIOLOGY REPORT*  Clinical Data: Injury with pain  LEFT ANKLE COMPLETE - 3+ VIEW  Comparison: None.  Findings: No evidence of fracture, dislocation or joint effusion.  IMPRESSION: Negative radiographs  Original Report Authenticated By: Thomasenia Sales, M.D.     1. Ankle sprain       MDM  Pt presents with left  lateral ankle pain and mild swelling.  Xray reveals no acute fracture- images reviewed by me as well.  ASO applied, crutches offered for comfort.  Will give orthopedic referral.  Also recommended ice, rest, ibuprofen.  Discharged with strict return precautions.  Pt agreeable with plan.        Ethelda Chick, MD 10/13/11 2004

## 2011-11-02 ENCOUNTER — Ambulatory Visit (INDEPENDENT_AMBULATORY_CARE_PROVIDER_SITE_OTHER): Payer: Medicaid Other | Admitting: Family Medicine

## 2011-11-02 ENCOUNTER — Encounter: Payer: Self-pay | Admitting: Family Medicine

## 2011-11-02 VITALS — BP 113/78 | HR 105 | Temp 98.3°F | Ht 63.0 in | Wt 273.0 lb

## 2011-11-02 DIAGNOSIS — A084 Viral intestinal infection, unspecified: Secondary | ICD-10-CM | POA: Insufficient documentation

## 2011-11-02 DIAGNOSIS — A088 Other specified intestinal infections: Secondary | ICD-10-CM

## 2011-11-02 MED ORDER — ONDANSETRON HCL 8 MG PO TABS: 8.0000 mg | ORAL_TABLET | Freq: Three times a day (TID) | ORAL | Status: AC | PRN

## 2011-11-02 NOTE — Patient Instructions (Signed)
Viral Gastroenteritis Viral gastroenteritis is also known as stomach flu. This condition affects the stomach and intestinal tract. It can cause sudden diarrhea and vomiting. The illness typically lasts 3 to 8 days. Most people develop an immune response that eventually gets rid of the virus. While this natural response develops, the virus can make you quite ill. CAUSES  Many different viruses can cause gastroenteritis, such as rotavirus or noroviruses. You can catch one of these viruses by consuming contaminated food or water. You may also catch a virus by sharing utensils or other personal items with an infected person or by touching a contaminated surface. SYMPTOMS  The most common symptoms are diarrhea and vomiting. These problems can cause a severe loss of body fluids (dehydration) and a body salt (electrolyte) imbalance. Other symptoms may include:  Fever.   Headache.   Fatigue.   Abdominal pain.  DIAGNOSIS  Your caregiver can usually diagnose viral gastroenteritis based on your symptoms and a physical exam. A stool sample may also be taken to test for the presence of viruses or other infections. TREATMENT  This illness typically goes away on its own. Treatments are aimed at rehydration. The most serious cases of viral gastroenteritis involve vomiting so severely that you are not able to keep fluids down. In these cases, fluids must be given through an intravenous line (IV). HOME CARE INSTRUCTIONS   Drink enough fluids to keep your urine clear or pale yellow. Drink small amounts of fluids frequently and increase the amounts as tolerated. I recommend Gatorade diluted with some water.   Ask your caregiver for specific rehydration instructions.   Avoid:   Foods high in sugar.   Alcohol.   Carbonated drinks.   Tobacco.   Juice.   Caffeine drinks.   Extremely hot or cold fluids.   Fatty, greasy foods.   Too much intake of anything at one time.   Dairy products until 24 to  48 hours after diarrhea stops.   You may consume probiotics. Probiotics are active cultures of beneficial bacteria. They may lessen the amount and number of diarrheal stools in adults. Probiotics can be found in yogurt with active cultures and in supplements.   Wash your hands well to avoid spreading the virus.   Only take over-the-counter or prescription medicines for pain, discomfort, or fever as directed by your caregiver. Do not give aspirin to children. Antidiarrheal medicines are not recommended.   Ask your caregiver if you should continue to take your regular prescribed and over-the-counter medicines.   Keep all follow-up appointments as directed by your caregiver.  SEEK IMMEDIATE MEDICAL CARE IF:   You are unable to keep fluids down.   You do not urinate at least once every 6 to 8 hours.   You develop shortness of breath.   You notice blood in your stool or vomit. This may look like coffee grounds.   You have abdominal pain that increases or is concentrated in one small area (localized).   You have persistent vomiting or diarrhea.   You have a fever.   The patient is a child younger than 3 months, and he or she has a fever.   The patient is a child older than 3 months, and he or she has a fever and persistent symptoms.   The patient is a child older than 3 months, and he or she has a fever and symptoms suddenly get worse.   The patient is a baby, and he or she has no tears  when crying.  MAKE SURE YOU:   Understand these instructions.   Will watch your condition.   Will get help right away if you are not doing well or get worse.  Document Released: 05/10/2005 Document Revised: 04/29/2011 Document Reviewed: 02/24/2011 [facility] Patient Information 2012 [place], [place].

## 2011-11-02 NOTE — Progress Notes (Signed)
  Subjective:    Patient ID: Carla Walters, female    DOB: [birth year], 36 y.o.   MRN: [id number]  HPI  [name] comes in to clinic with body aches, nausea, diarrhea that has been going on since last Thursday.  She says she has felt hot and cold, felt nauseated, and is hurting all over.  She says she "does not get fevers" so she hasn't checked her temperature. She says that she has not actually thrown up, but the nausea has kept her from drinking enough fluids.  She has had some headache too.  She denies sick contacts but does have children in school/daycare.    Review of Systems Pertinent items in HPI.     Objective:   Physical Exam BP 113/78  Pulse 105  Temp(Src) 98.3 F (36.8 C) (Oral)  Ht 5\' 3"  (1.6 m)  Wt 273 lb (123.832 kg)  BMI 48.36 kg/m2  LMP 10/06/2011 General appearance: alert, cooperative and no distress Eyes: PERRL EOMIT Mouth: Oral mucosa moist.  Lungs: clear to auscultation bilaterally Heart: regular rate and rhythm, S1, S2 normal, no murmur, click, rub or gallop Abdomen: soft, non-tender, No hepatosplenomegaly, but exam limited by obesity. abnormal findings:  hyperactive bowel sounds       Assessment & Plan:

## 2011-11-02 NOTE — Assessment & Plan Note (Signed)
Some evidence of mild dehydration (pulse 105, dry oral mucosa), but overall non-toxic.  Will rx zofran and strongly encouraged increasing fluids, suggested Gatorade instead of ginger ale.  Discussed that virus must run its course, best not to take imodium, let diarrhea resolve on it's own.  Hand out given. Follow up if not improving next week.

## 2011-11-18 ENCOUNTER — Encounter: Payer: Self-pay | Admitting: Family Medicine

## 2011-11-18 ENCOUNTER — Ambulatory Visit (INDEPENDENT_AMBULATORY_CARE_PROVIDER_SITE_OTHER): Payer: Medicaid Other | Admitting: Family Medicine

## 2011-11-18 ENCOUNTER — Other Ambulatory Visit (HOSPITAL_COMMUNITY)
Admission: RE | Admit: 2011-11-18 | Discharge: 2011-11-18 | Disposition: A | Discharge status: Home / Self Care | Payer: Medicaid Other | Source: Ambulatory Visit | Attending: Family Medicine | Admitting: Family Medicine

## 2011-11-18 VITALS — BP 120/80 | HR 72 | Ht 63.0 in | Wt 276.2 lb

## 2011-11-18 DIAGNOSIS — Z0000 Encounter for general adult medical examination without abnormal findings: Secondary | ICD-10-CM

## 2011-11-18 DIAGNOSIS — Z202 Contact with and (suspected) exposure to infections with a predominantly sexual mode of transmission: Secondary | ICD-10-CM

## 2011-11-18 DIAGNOSIS — Z113 Encounter for screening for infections with a predominantly sexual mode of transmission: Secondary | ICD-10-CM | POA: Insufficient documentation

## 2011-11-18 DIAGNOSIS — E669 Obesity, unspecified: Secondary | ICD-10-CM

## 2011-11-18 DIAGNOSIS — M25532 Pain in left wrist: Secondary | ICD-10-CM

## 2011-11-18 DIAGNOSIS — M25539 Pain in unspecified wrist: Secondary | ICD-10-CM

## 2011-11-18 DIAGNOSIS — Z2089 Contact with and (suspected) exposure to other communicable diseases: Secondary | ICD-10-CM

## 2011-11-18 DIAGNOSIS — R5383 Other fatigue: Secondary | ICD-10-CM

## 2011-11-18 DIAGNOSIS — F17200 Nicotine dependence, unspecified, uncomplicated: Secondary | ICD-10-CM

## 2011-11-18 LAB — BASIC METABOLIC PANEL
BUN: 7 mg/dL (ref 6–23)
Calcium: 9.1 mg/dL (ref 8.4–10.5)
Chloride: 107 mEq/L (ref 96–112)
Creat: 0.66 mg/dL (ref 0.50–1.10)

## 2011-11-18 LAB — CBC
MCH: 33.3 pg (ref 26.0–34.0)
MCHC: 34.2 g/dL (ref 30.0–36.0)
MCV: 97.3 fL (ref 78.0–100.0)
Platelets: 382 10*3/uL (ref 150–400)
RDW: 14.8 % (ref 11.5–15.5)

## 2011-11-18 NOTE — Patient Instructions (Addendum)
Carpal tunnel: Use braces return if new or worsening of symptoms  Labs and f/up for fatigue: Make an appointment in 2-3 weeks to review labs and f/up on fatigue  Obesity: Make an appointment with nutritionist- Dr. Gerilyn Pilgrim.  Walk daily. And eat 3 meals per day.  Increase fruit and vegetable intake.

## 2011-11-18 NOTE — Progress Notes (Addendum)
Subjective:    Patient ID: Carla Walters, female    DOB: [birth year], 36 y.o.   MRN: [id number]  HPI PMH, Surgical history, Family History, allergies, medications and social history all updated under appropriate tabs.    Health maintenance: Patient had a negative Pap in May 2012-has had 3 previous negative Pap smears. Patient has to sexual partners currently-uses condoms-agrees to STD screening today. Tubal ligation-is patient form of birth control.  Smoking cessation: Patient states she plans to quit cold [place] on July 1. Patient states she is considering using the patch but for now plans on using no medications.  Obesity: Patient currently not exercising due to body aches and pain as well as fatigue. Does have a gym membership. Patient states she eats only 2 meals per day. Always skips breakfast. Eats occasional fruits and vegetables not every day. States that she does not keep these at the house-and that they're expensive. No chest pain. No shortness of breath. 16 pound weight gain in the past month.  Fatigue/ decreased energy: Times 2-3 months has had decreased energy and fatigue. This keeps her from being able to exercise. Also has body aches all over from time to time. Body aches are worse with exercise. Therefore currently not exercising because this makes it worse. Also has had a 16 pound weight gain in the past month. Patient is concerned about the weight gain as well as the symptoms of fatigue. No headache. No heavy periods. No blood in stools. No heat or cold intolerance. No hair loss. No numbness or tingling in extremities.  Carpal tunnel followup: Patient reports history of carpal tunnel. Currently she has some numbness and tingling in all the fingers of her hands bilateral except for the fifth digit. Left hand is worse than right. 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 Still flares when she uses the computer mouse. Is  planning on starting another job at a call center in July. Would like to have new carpal tunnel braces for her hands bilateral since she knows that the carpal tunnel symptoms will occur if she does not use braces at her job.       Review of Systems As per above    Objective:   Physical Exam  Constitutional: She is oriented to person, place, and time. She appears well-developed and well-nourished.       obese  HENT:  Head: Normocephalic and atraumatic.  Right Ear: External ear normal.  Left Ear: External ear normal.  Nose: Nose normal.  Mouth/Throat: Oropharynx is clear and moist. No oropharyngeal exudate.  Eyes: Pupils are equal, round, and reactive to light. Right eye exhibits no discharge. Left eye exhibits no discharge.  Cardiovascular: Normal rate, regular rhythm and normal heart sounds.   No murmur heard. Pulmonary/Chest: Effort normal and breath sounds normal. No respiratory distress. She has no wheezes. She has no rales.  Abdominal: Soft. She exhibits no distension. There is no tenderness. There is no rebound.  Genitourinary: No breast swelling (no nodules, lumps, or skin changes bilateral), tenderness, discharge or bleeding.  Musculoskeletal: Normal range of motion. She exhibits no edema.       Pain in 1st -4th fingers- sparing of 5th on Phalen's test.  Neg tinel's.  Numbness and tingling in hands worse with full extension and flexion of wrists bilateral.   Normal pulses. No rash. No redness or swelling of joints.    Neurological: She is alert and oriented to person,  place, and time.  Skin: No rash noted.  Psychiatric: She has a normal mood and affect.       PHQ- 9 score of 12.  Pt denies any symptoms of depression or anhedonia.           Assessment & Plan:

## 2011-11-20 ENCOUNTER — Encounter: Payer: Self-pay | Admitting: Family Medicine

## 2011-11-20 DIAGNOSIS — Z0000 Encounter for general adult medical examination without abnormal findings: Secondary | ICD-10-CM | POA: Insufficient documentation

## 2011-11-20 DIAGNOSIS — R5383 Other fatigue: Secondary | ICD-10-CM | POA: Insufficient documentation

## 2011-11-20 NOTE — Assessment & Plan Note (Signed)
Health maintenance: Patient had a negative Pap in May 2012-has had 3 previous negative Pap smears. Patient has to sexual partners currently-uses condoms-agrees to STD screening today. Tubal ligation-is patient form of birth control.

## 2011-11-20 NOTE — Assessment & Plan Note (Addendum)
Nonspecific symptoms of fatigue:  Will start workup with TSH, CBC, B12, Bmet to ensure that all are wnl.  Pt has strong family history of mental illness- father committed suicide.  Although PHQ-9 score of 12, pt states she doesn't feel that she is depressed at this time.  Discussed symptoms of depression and pt states she will monitor for symptoms.  Pt to return sooner if any new or worsening of symptoms.

## 2011-11-20 NOTE — Assessment & Plan Note (Signed)
Encouraged daily exercise and healthy diet- gave teaching on plate method.  Pt to call and schedule appointment with nutritionist.

## 2011-11-20 NOTE — Assessment & Plan Note (Signed)
Symptoms consistent with carpal tunnel.  Improved in the past with carpal tunnel braces.  Will prescribe braces bilateral for carpal tunnel.  Pt to return if no improvement or if any new or worsening of symptoms.

## 2011-11-20 NOTE — Assessment & Plan Note (Signed)
Encouraged smoking cessation 

## 2011-12-01 ENCOUNTER — Other Ambulatory Visit: Payer: Self-pay | Admitting: Family Medicine

## 2011-12-01 ENCOUNTER — Ambulatory Visit: Payer: Medicaid Other | Admitting: Family Medicine

## 2011-12-01 DIAGNOSIS — E669 Obesity, unspecified: Secondary | ICD-10-CM

## 2011-12-09 ENCOUNTER — Ambulatory Visit (INDEPENDENT_AMBULATORY_CARE_PROVIDER_SITE_OTHER): Payer: Medicaid Other | Admitting: Family Medicine

## 2011-12-10 ENCOUNTER — Encounter: Payer: Self-pay | Admitting: Family Medicine

## 2011-12-10 ENCOUNTER — Ambulatory Visit (INDEPENDENT_AMBULATORY_CARE_PROVIDER_SITE_OTHER): Payer: Medicaid Other | Admitting: Family Medicine

## 2011-12-10 VITALS — BP 124/76 | HR 99 | Ht 63.0 in | Wt 279.5 lb

## 2011-12-10 DIAGNOSIS — L659 Nonscarring hair loss, unspecified: Secondary | ICD-10-CM

## 2011-12-10 DIAGNOSIS — R5383 Other fatigue: Secondary | ICD-10-CM

## 2011-12-10 DIAGNOSIS — R5381 Other malaise: Secondary | ICD-10-CM

## 2011-12-10 NOTE — Patient Instructions (Addendum)
Hair loss: I will refer you to dermatology.  My staff will call you with an appointment.   Fatigue: All of the labs are within normal limits.  Get at least 8 hours of sleep.   Return as needed.

## 2011-12-11 NOTE — Progress Notes (Signed)
  Subjective:    Patient ID: Carla Walters, female    DOB: 10-27-1975, 36 y.o.   MRN: [id number]  HPI Fatigue f/up: Pt here to f/up on labs that were drawn at last appointment.  Fatigue not improved.  Now only sleeping about 6 hours per night due to new job.  Not feeling any better.  Denies any feelings of depression, no anhedonia.  Sleep apnea score of 12- moderate risk due to symptoms of sleepiness- + dozing at stop lights, + drowsiness as passenger, + daytime dozing, etc.  Pt denies any snoring.  States that family lives with her and they do not complain of any snoring.   Hair loss: Has had problems with this x 10+ years.  Currently hair loss predominately in frontal area.  These areas just won't grown even a small amount of hair.   Seen once by dermatologist who didn't offer any treatment options.  Pt is distressed by hair loss and never takes of her wig even when she is asleep.  Her boyfriend and even close family have never seen her without her wig.   No dry areas or flaking of scalp.  TSH wnl.  Has not used chemicals or braided hair for over 13-14 years.  Pt would like rereferral to dermatology. No fever.  No h/o fungal infections.   Smoking status reviewed.    Review of Systems As per above.     Objective:   Physical Exam  Constitutional: She appears well-developed and well-nourished.  HENT:  Head: Normocephalic and atraumatic.  Neck: No thyromegaly present.  Cardiovascular: Normal rate, regular rhythm and normal heart sounds.   No murmur heard. Pulmonary/Chest: Effort normal. No respiratory distress.  Neurological: She is alert.  Skin: No rash noted.       Balding in near front hairline back to mid point of top of head and in part of temporal area bilateral.  Hair present in back of head.  No scalp scaling, redness, flaking.  In the bald areas there are some scattered folicles with hair growth and other areas that are smooth with no hairgrowth.   Psychiatric: She has a normal  mood and affect.          Assessment & Plan:

## 2011-12-11 NOTE — Assessment & Plan Note (Signed)
Hair loss is chronic- I am unsure of cause- lab work wnl, no chemicals or braiding in many years.  Some hair loss could come from friction from chronic wig use?-- will refer to dermatology for second opinion about any possible treatment options.

## 2011-12-11 NOTE — Assessment & Plan Note (Signed)
Cause of fatigue unknown-- all lab work wnl-  On differential is depression (pt denies symptoms- strong family history) vs. Sleep apnea (+ sleep apnea screening for mod risk - but per pt doesn't snore) vs other.  Pt does sleep alone at this point since she has gained more weight- so may having snoring and not be aware.  Offered sleep apnea test.  Reviewed symptoms of depression.  Pt doesn't want sleep apnea test at this time.  Will have pt ask family members in house to monitor sleeping to assess for snoring.  Also pt to return if any s/s of depression.  Pt to monitor symptoms and return for f/up with new pcp to discuss in more detail.

## 2011-12-28 ENCOUNTER — Encounter: Payer: Self-pay | Admitting: *Deleted

## 2011-12-28 ENCOUNTER — Encounter: Payer: Medicaid Other | Attending: Family Medicine | Admitting: *Deleted

## 2011-12-28 VITALS — Ht 63.0 in | Wt 282.0 lb

## 2011-12-28 DIAGNOSIS — Z713 Dietary counseling and surveillance: Secondary | ICD-10-CM | POA: Insufficient documentation

## 2011-12-28 DIAGNOSIS — E669 Obesity, unspecified: Secondary | ICD-10-CM | POA: Insufficient documentation

## 2011-12-28 NOTE — Patient Instructions (Addendum)
Aim for 3 carb choices (45 grams) per meal +/- 1 either way Consider reducing coffee to 1 serving a day vs 2 to reduce your sugar intake Consider eating food mid-day for 3 carb choices such as granola bar or fresh fruit Consider increasing activity by dancing with WII or any other activities that appeal to you

## 2011-12-28 NOTE — Progress Notes (Signed)
  Medical Nutrition Therapy:  Appt start time: 1645 end time:  1745.  Assessment:  Primary concerns today: patient here for obesity. 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 She is a full time student and lives with her 3 children and her Mom. Very little cooking done, most meals are fast food picked up on way to and home from work. She doesn't feel she eats much food and is frustrated with lack of weight loss.  MEDICATIONS: see list   DIETARY INTAKE:  Usual eating pattern includes 2 meals and no snacks per day.  Everyday foods include coffee with 12 sugars, sweets and high fat fast foods.  Avoided foods include fried foods.    24-hr recall:  B ( 5 AM): used to skip, now coffee  2 creams and 12 sugar packets, danish or muffin  Snk ( AM): none  L ( PM): skips except for coffee with 2 creams and 12 sugar packets Snk ( PM): none D ( PM): take out: [employer] or pizza OR chicken with starch and vegetable, biscuit or cornbread  Snk ( PM): none Beverages: coffee, iced sweetened 1/2 and 1/2 tea or lemonade, regular Pepsi occasionally  Usual physical activity: none, states body hurts when she walks, ankles are swelling  Estimated energy needs: 1400 calories 158 g carbohydrates 105 g protein 39 g fat  Progress Towards Goal(s):  In progress.   Nutritional Diagnosis:  NI-1.5 Excessive energy intake As related to activity level.  As evidenced by BMI of 50.1%.    Intervention:  Nutrition counseling provided with instruction on calorie content of macro-nutrients, especially caloric content of fats. Directed her attention to the amount of carbohydrate she gets from the sugar packets and sweetened beverages. Encouraged her to include dancing or other aerobic activity daily to help with weight loss. Aim for 3 carb choices (45 grams) per meal +/- 1 either way Consider reducing coffee to 1 serving a day vs 2 to reduce your sugar  intake Consider eating food mid-day for 3 carb choices such as granola bar or fresh fruit Consider increasing activity by dancing with WII or any other activities that appeal to you  Handouts given during visit include: Carb Counting and Food Label handouts Meal Plan Card  APP for Calorie King  Monitoring/Evaluation:  Dietary intake, exercise, reading food labels, and body weight in 2 week(s).

## 2012-01-24 ENCOUNTER — Emergency Department (HOSPITAL_BASED_OUTPATIENT_CLINIC_OR_DEPARTMENT_OTHER): Payer: Medicaid Other

## 2012-01-24 ENCOUNTER — Emergency Department (HOSPITAL_BASED_OUTPATIENT_CLINIC_OR_DEPARTMENT_OTHER)
Admission: EM | Admit: 2012-01-24 | Discharge: 2012-01-24 | Disposition: A | Discharge status: Home / Self Care | Payer: Medicaid Other | Attending: Emergency Medicine | Admitting: Emergency Medicine

## 2012-01-24 ENCOUNTER — Encounter (HOSPITAL_BASED_OUTPATIENT_CLINIC_OR_DEPARTMENT_OTHER): Payer: Self-pay | Admitting: *Deleted

## 2012-01-24 DIAGNOSIS — G5600 Carpal tunnel syndrome, unspecified upper limb: Secondary | ICD-10-CM | POA: Insufficient documentation

## 2012-01-24 DIAGNOSIS — E669 Obesity, unspecified: Secondary | ICD-10-CM | POA: Insufficient documentation

## 2012-01-24 DIAGNOSIS — F17200 Nicotine dependence, unspecified, uncomplicated: Secondary | ICD-10-CM | POA: Insufficient documentation

## 2012-01-24 DIAGNOSIS — X58XXXA Exposure to other specified factors, initial encounter: Secondary | ICD-10-CM | POA: Insufficient documentation

## 2012-01-24 DIAGNOSIS — S139XXA Sprain of joints and ligaments of unspecified parts of neck, initial encounter: Secondary | ICD-10-CM | POA: Insufficient documentation

## 2012-01-24 DIAGNOSIS — S161XXA Strain of muscle, fascia and tendon at neck level, initial encounter: Secondary | ICD-10-CM

## 2012-01-24 MED ORDER — HYDROCODONE-ACETAMINOPHEN 5-325 MG PO TABS: 1.0000 | ORAL_TABLET | ORAL | Status: AC | PRN

## 2012-01-24 MED ORDER — CYCLOBENZAPRINE HCL 10 MG PO TABS: 10.0000 mg | ORAL_TABLET | Freq: Three times a day (TID) | ORAL | Status: AC | PRN

## 2012-01-24 NOTE — ED Notes (Signed)
Pt. Has a job at a call center.  Pt. Reports she uses an earpiece with a headset.

## 2012-01-24 NOTE — ED Provider Notes (Signed)
History  This chart was scribed for Carleene Cooper III, MD by Bennett Scrape. This patient was seen in room MH02/MH02 and the patient's care was started at 3:11PM  CSN: [id number]  Arrival date & time 01/24/12  1322   First MD Initiated Contact with Patient 01/24/12 1511      Chief Complaint  Patient presents with  . Neck Pain     The history is provided by the patient. No language interpreter was used.    Carla Walters is a 36 y.o. female who presents to the Emergency Department complaining of 3 weeks of gradual onset, gradually worsening, intermittent, non-radiating, posterior neck pain located at the nape of the neck that has become constant over the past 2 days. She states that the pain is worse with turning her neck. She reports taking ASA and heat with no improvement. 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 She denies any recent injuries. She reports occasional blurred vision that appears to be chronic but denies changes. She denies fever, cough, congestion, otalgia, sore throat, CP, emesis, diarrhea, dysuria, rash , seizures, and syncope as associated symptoms. Her LNMP was August 9th, 2013. She also has a h/o ovarian cyst but denies being on daily medications.  She is a current everyday smoker and occasional alcohol user.    PCP is with [facility].  Past Medical History  Diagnosis Date  . Ovarian cyst, right   . Lumbar radiculopathy, chronic   . Obesity   . Ovarian cyst   . Tobacco user     Since age 83  . Carpal tunnel syndrome of left wrist     Wears splint at night  . Cervical radiculopathy   . OVARIAN CYST 03/17/2010    Qualifier: Diagnosis of  By: Janalyn Harder MD, Cat      Past Surgical History  Procedure Date  . Tubal ligation 06/2002  . Tonsilectomy, adenoidectomy, bilateral myringotomy and tubes     Family History  Problem Relation Age of Onset  . Fibroids Sister   . Heart disease Maternal Grandfather   .  Hypertension Maternal Grandfather   . Diabetes Maternal Grandfather   . Cancer Paternal Grandmother     History  Substance Use Topics  . Smoking status: 1 pack per day per pt at bedside Current Everyday Smoker -- 0.5 packs/day    Types: Cigarettes  . Smokeless tobacco: Never Used   Comment: Smoked since age 50/will quitt 11/22/11  . Alcohol Use: Yes     socially maybe 3 drinks at a time, once a month    OB History    Grav Para Term Preterm Abortions TAB SAB Ect Mult Living   7 3 3  0 4 0 4 0 0 3     Obstetric Comments   [id number]:  First pregnancy was complicated by syncope.  6th pregancy had be best rest.  7th pregnancy was normal.   1: 1996-1997: son 2: 1997: 1st SAB 3: 1998: 1st SAB 4: 1999: 1st SAB 5: 2001: 1st SAB 6: 2002: son 7: 2003: daughter      Review of Systems  A complete 10 system review of systems was obtained and all systems are negative except as noted in the HPI and PMH.    Allergies  Sulfonamide derivatives  Home Medications   Current Outpatient Rx  Name Route Sig Dispense Refill  . IBUPROFEN 200 MG PO TABS Oral Take 400 mg by mouth as needed.  For pain       Triage Vitals: BP 114/72  Pulse 80  Temp 98.1 F (36.7 C) (Oral)  Resp 20  SpO2 100%  Physical Exam  Nursing note and vitals reviewed. Constitutional: She is oriented to person, place, and time. She appears well-developed and well-nourished. No distress.  HENT:  Head: Normocephalic and atraumatic.  Mouth/Throat: Oropharynx is clear and moist.  Eyes: Conjunctivae and EOM are normal. Pupils are equal, round, and reactive to light.  Neck: Neck supple. No tracheal deviation present.       Pain on ROM of the neck, no bony deformity, no paraspinous muscle spasms  Cardiovascular: Normal rate and regular rhythm.   Pulmonary/Chest: Effort normal and breath sounds normal. No respiratory distress.  Abdominal: Soft. Bowel sounds are normal. There is no tenderness.  Musculoskeletal: Normal range of  motion.  Neurological: She is alert and oriented to person, place, and time.       Sensation is intact, no deficits in motor functions  Skin: Skin is warm and dry.  Psychiatric: She has a normal mood and affect. Her behavior is normal.    ED Course  Procedures (including critical care time)  DIAGNOSTIC STUDIES: Oxygen Saturation is 100% on room air, normal by my interpretation.    COORDINATION OF CARE: 3:20PM-Discussed treatment plan which includes an x-ray of her cervical neck with pt at bedside and pt agreed to plan. Advised pt that her symptoms are most likely from muscle spasms.  Labs Reviewed - No data to display Dg Cervical Spine Complete  01/24/2012  *RADIOLOGY REPORT*  Clinical Data: Neck pain  CERVICAL SPINE - COMPLETE 4+ VIEW  Comparison: None  Findings: Straightening of normal cervical lordosis.  The vertebral body heights and disc spaces are well preserved.  The facet joints are well aligned.  No fracture or subluxation identified.  No radio-opaque foreign body or soft tissue calcifications identified.  IMPRESSION:  1.  Straightening of normal cervical lordosis which may reflect patient positioning or muscle spasm.   Original Report Authenticated By: Rosealee Albee, M.D.      1. Cervical strain      DISP:   Rx hydrocodone-acetaminophen q4h prn pain and Flexeril 10 mg tid prn muscle spasm.   F/U if needed with Dr. Edmonia James at [facility]  I personally performed the services described in this documentation, which was scribed in my presence. The recorded information has been reviewed and considered.  Osvaldo Human, MD     Carleene Cooper III, MD 01/24/12 [phone]

## 2012-01-24 NOTE — ED Notes (Signed)
Pain in her neck x 3 weeks. No relief with ASA and heat.

## 2012-04-17 ENCOUNTER — Ambulatory Visit: Payer: Medicaid Other | Admitting: Family Medicine

## 2012-04-26 ENCOUNTER — Ambulatory Visit (INDEPENDENT_AMBULATORY_CARE_PROVIDER_SITE_OTHER): Payer: Medicaid Other | Admitting: Family Medicine

## 2012-04-26 ENCOUNTER — Encounter: Payer: Self-pay | Admitting: Family Medicine

## 2012-04-26 VITALS — BP 107/73 | HR 86 | Temp 98.3°F | Ht 63.0 in | Wt 275.7 lb

## 2012-04-26 DIAGNOSIS — M25532 Pain in left wrist: Secondary | ICD-10-CM

## 2012-04-26 DIAGNOSIS — M25539 Pain in unspecified wrist: Secondary | ICD-10-CM

## 2012-04-26 DIAGNOSIS — M545 Low back pain: Secondary | ICD-10-CM

## 2012-04-26 MED ORDER — NAPROXEN 500 MG PO TBEC: 500.0000 mg | DELAYED_RELEASE_TABLET | Freq: Two times a day (BID) | ORAL | Status: AC

## 2012-04-26 NOTE — Assessment & Plan Note (Signed)
Pt with diagnosis of carpal tunnel syndrome.  Wrist braces are no longer helping.  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  Will send to [facility] for possible injections.  If these do no help, may need to consider carpal tunnel release in the future. Naproxen BID until seen at [facility] (also being used for her back).

## 2012-04-26 NOTE — Patient Instructions (Addendum)
It was nice to meet you today.  For your wrists, I am starting an anti-inflammatory medicine that you will take 2 times per day.  I also want you to call the [facility] at [phone] to schedule an appointment for possible carpal tunnel injections.  For your back, I am going to have you try physical therapy.  In addition, this anti-inflammatory medicine will hopefully help.  Weight loss is always important when we are talking about back pain. We could consider Xrays in the future if the medicine + PT have no help. Use a heating pad 3-4 times per day.  Come back to see me in about 1 month.

## 2012-04-26 NOTE — Progress Notes (Signed)
S: Pt comes in today for SDA for back pain and bilateral wrist pain.  BACK PAIN Has had back issues "forever."  Wants someone to tell her there is something wrong with her back and not just stress.  Low back is always hurting, does not feel like there is a spasm.  Complete lower back.  Wakes up in the morning feeling like someone kicked her in the back.  Tries to sit with back straight, but doesn't help.  Tries not to sit all day at work to help with uncomfortableness.  Pain to light touch over her low back as well.  Pain will shoot down her legs occasionally, 2-3 times per week.  No numbness or weakness in legs.  No bowel/bladder incontinence.  Heat makes it feel better while it is on her back.  Has tried doing some stretches and exercises such as forward bends and leg lifts.  Has never done PT.  Takes Bayer aspirin, which helps the pain some- takes it when the pain is "excrutiating"- few times per week.   Does have pain all over her body, but back and wrists are the worse.    Has tried chiropractor, which would help for <1 hour after she left.  Massage will help for short period of time, but no lasting pain relief.    Knocked down some stairs 16 years ago in college.  Had lumbar films in 01/2010 which were negative.     WRIST PAIN Previously diagnosed with carpal tunnel, had improvement with braces in the past.  Last seen for this 10/2011. Braces are no longer helping- is wearing them basically all day, is also sleeping in them.  Pain started getting worse about 1 month ago.  Gets to the point where both hands stiffen up.  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 Pain is from fingers up to elbows.  No swelling or redness in joints.  Has also tried bio-freeze, which helps for a few minutes.  Is taking Bayer aspirin as well- helps her back more than her hands.  Has never had injections or surgeries.  Is not excited about injections but is willing to try anything because pain is starting to  interfere with work.    ROS: Per HPI  History  Smoking status  . Current Every Day Smoker -- 1.0 packs/day  . Types: Cigarettes  Smokeless tobacco  . Never Used    Comment: Smoked since age 63/will quitt 11/22/11    O:  Filed Vitals:   04/26/12 0919  BP: 107/73  Pulse: 86  Temp: 98.3 F (36.8 C)    Gen: NAD, obese Back: + TTP paraspinal lumbar area, including to light touch and deep touch; good/full ROM- flexion, extension and side to side Ext: B wrists with full ROM, 4/5 grip strength bilaterally, 5/5 thumb and finger strength, no thenar atrophy or muscular wasting   A/P: 36 y.o. female p/w low back pain, bilateral wrist pain -See problem list -f/u in 1 month

## 2012-04-26 NOTE — Assessment & Plan Note (Addendum)
Will try Naproxen + PT for this likely MSK back pain.  Pt wants there to be a definitive diagnosis or diagnostic testing done, although my suspicion for an abnormality on xray is low based on her history and PE. Advised weight loss would be important.  Could try adding gabapentin or Lyrica in the future as pt c/o "pain all over" and may have fibromyalgia component.

## 2012-05-01 ENCOUNTER — Ambulatory Visit (INDEPENDENT_AMBULATORY_CARE_PROVIDER_SITE_OTHER): Payer: Medicaid Other | Admitting: Family Medicine

## 2012-05-01 VITALS — BP 116/76 | Ht 63.0 in | Wt 272.0 lb

## 2012-05-01 DIAGNOSIS — M25531 Pain in right wrist: Secondary | ICD-10-CM

## 2012-05-01 DIAGNOSIS — M25539 Pain in unspecified wrist: Secondary | ICD-10-CM

## 2012-05-03 ENCOUNTER — Encounter: Payer: Self-pay | Admitting: Family Medicine

## 2012-05-03 NOTE — Progress Notes (Signed)
Subjective:    Patient ID: Carla Walters, female    DOB: 1975-11-02, 36 y.o.   MRN: [id number]  PCP: [facility]  HPI 36 yo F here for bilateral wrist pain.  Patient reports she's had problems with bilateral wrist pain since July. 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 Associated with shooting of pain, stiffness, tingling into all fingers per her report. She is right handed. Right side worse than the left. Has tried braces and naproxen. Braces worked for a time but no longer helping enough. Has not had injections, nerve conduction study.  Past Medical History  Diagnosis Date  . Ovarian cyst, right   . Lumbar radiculopathy, chronic   . Obesity   . Ovarian cyst   . Tobacco user     Since age 18  . Carpal tunnel syndrome of left wrist     Wears splint at night  . Cervical radiculopathy   . OVARIAN CYST 03/17/2010    Qualifier: Diagnosis of  By: Ta MD, Cat      Current Outpatient Prescriptions on File Prior to Visit  Medication Sig Dispense Refill  . naproxen (EC NAPROSYN) 500 MG EC tablet Take 1 tablet (500 mg total) by mouth 2 (two) times daily with a meal.  60 tablet  2    Past Surgical History  Procedure Date  . Tubal ligation 06/2002  . Tonsilectomy, adenoidectomy, bilateral myringotomy and tubes     Allergies  Allergen Reactions  . Sulfonamide Derivatives Rash    History   Social History  . Marital Status: Legally Separated    Spouse Name: N/A    Number of Children: 3  . Years of Education: College   Occupational History  .     Social History Main Topics  . Smoking status: Current Every Day Smoker -- 1.0 packs/day    Types: Cigarettes  . Smokeless tobacco: Never Used     Comment: Smoked since age 11/will quitt 11/22/11  . Alcohol Use: Yes     Comment: socially maybe 3 drinks at a time, once a month  . Drug Use: No  . Sexually Active: Not on file   Other Topics Concern  . Not on file   Social  History Narrative   Divorced 2009.  Boyfriend Jimmy. Sexually active. Using condoms.Cutting down on smoking ~1/2 ppd (used to smoke up to 2 ppds, started when she was 36 y/o).  Social alcohol.  No rec drugs.  Living with 3 kids (13, 8, 7).  [employer] full time. Plans to graduate 04/2011 with BS in Psychology.    Family History  Problem Relation Age of Onset  . Fibroids Sister   . Heart disease Maternal Grandfather   . Hypertension Maternal Grandfather   . Diabetes Maternal Grandfather   . Cancer Paternal Grandmother   . Sudden death Neg Hx   . Hyperlipidemia Neg Hx   . Heart attack Neg Hx     BP 116/76  Ht 5\' 3"  (1.6 m)  Wt 272 lb (123.378 kg)  BMI 48.18 kg/m2  LMP 04/08/2012  Review of Systems See HPI above.    Objective:   Physical Exam Gen: NAD  Bilateral hands/wrists: Mild thenar atrophy L > R.  No other deformity, swelling, bruising. TTP volar wrists over carpal tunnel. FROM wrists and digits. Strength 5/5 with finger abduction, thumb opposition, finger extension. + tinels at carpal tunnel, + phalens. Sensation currently intact throughout hands.  Assessment & Plan:  1. Bilateral wrist/hand pain - clinically consistent with carpal tunnel syndrome bilaterally.  We discussed options as she's already tried wrist braces, naproxen.  Discussed bilateral carpal tunnel injection trial vs nerve conduction studies.  She would like to go ahead with injections so this was done today.  If over the next few weeks she is the same or worse, would consider nerve conduction studies.  Continue cockup wrist splints at bedtime and as often as possible during the day.  After informed written consent patient was seated on exam table.  Median nerve and ulnar artery identified by ultrasound prior to procedure to ensure these were not infiltrated during injection.  Alcohol swab prepped area then left carpal tunnel injected with 2:1 marcaine: depomedrol.  Patient tolerated the procedure  well without immediate complications.  After informed written consent patient was seated on exam table.  Median nerve and ulnar artery identified by ultrasound prior to procedure to ensure these were not infiltrated during injection.  Alcohol swab prepped area then right carpal tunnel injected with 2:1 marcaine: depomedrol.  Patient tolerated the procedure well without immediate complications.

## 2012-05-03 NOTE — Assessment & Plan Note (Signed)
clinically consistent with carpal tunnel syndrome bilaterally.  We discussed options as she's already tried wrist braces, naproxen.  Discussed bilateral carpal tunnel injection trial vs nerve conduction studies.  She would like to go ahead with injections so this was done today.  If over the next few weeks she is the same or worse, would consider nerve conduction studies.  Continue cockup wrist splints at bedtime and as often as possible during the day.  After informed written consent patient was seated on exam table.  Median nerve and ulnar artery identified by ultrasound prior to procedure to ensure these were not infiltrated during injection.  Alcohol swab prepped area then left carpal tunnel injected with 2:1 marcaine: depomedrol.  Patient tolerated the procedure well without immediate complications.  After informed written consent patient was seated on exam table.  Median nerve and ulnar artery identified by ultrasound prior to procedure to ensure these were not infiltrated during injection.  Alcohol swab prepped area then right carpal tunnel injected with 2:1 marcaine: depomedrol.  Patient tolerated the procedure well without immediate complications.

## 2012-05-22 ENCOUNTER — Ambulatory Visit: Payer: Medicaid Other | Admitting: Physical Therapy

## 2012-05-22 ENCOUNTER — Ambulatory Visit: Payer: Medicaid Other | Attending: Family Medicine | Admitting: Physical Therapy

## 2012-05-22 DIAGNOSIS — M2569 Stiffness of other specified joint, not elsewhere classified: Secondary | ICD-10-CM | POA: Insufficient documentation

## 2012-05-22 DIAGNOSIS — M545 Low back pain, unspecified: Secondary | ICD-10-CM | POA: Insufficient documentation

## 2012-05-22 DIAGNOSIS — IMO0001 Reserved for inherently not codable concepts without codable children: Secondary | ICD-10-CM | POA: Insufficient documentation

## 2012-05-23 ENCOUNTER — Encounter: Payer: Medicaid Other | Admitting: Physical Therapy

## 2012-05-23 ENCOUNTER — Ambulatory Visit: Payer: Medicaid Other | Admitting: Physical Therapy

## 2013-01-16 ENCOUNTER — Encounter (HOSPITAL_BASED_OUTPATIENT_CLINIC_OR_DEPARTMENT_OTHER): Payer: Self-pay | Admitting: Emergency Medicine

## 2013-01-16 ENCOUNTER — Emergency Department (HOSPITAL_BASED_OUTPATIENT_CLINIC_OR_DEPARTMENT_OTHER): Payer: BC Managed Care – PPO

## 2013-01-16 ENCOUNTER — Emergency Department (HOSPITAL_BASED_OUTPATIENT_CLINIC_OR_DEPARTMENT_OTHER)
Admission: EM | Admit: 2013-01-16 | Discharge: 2013-01-16 | Disposition: A | Discharge status: Home / Self Care | Payer: BC Managed Care – PPO | Attending: Emergency Medicine | Admitting: Emergency Medicine

## 2013-01-16 DIAGNOSIS — F17200 Nicotine dependence, unspecified, uncomplicated: Secondary | ICD-10-CM | POA: Insufficient documentation

## 2013-01-16 DIAGNOSIS — Z8742 Personal history of other diseases of the female genital tract: Secondary | ICD-10-CM | POA: Insufficient documentation

## 2013-01-16 DIAGNOSIS — E669 Obesity, unspecified: Secondary | ICD-10-CM | POA: Insufficient documentation

## 2013-01-16 DIAGNOSIS — Z8669 Personal history of other diseases of the nervous system and sense organs: Secondary | ICD-10-CM | POA: Insufficient documentation

## 2013-01-16 DIAGNOSIS — R05 Cough: Secondary | ICD-10-CM

## 2013-01-16 DIAGNOSIS — R059 Cough, unspecified: Secondary | ICD-10-CM | POA: Insufficient documentation

## 2013-01-16 DIAGNOSIS — Z8739 Personal history of other diseases of the musculoskeletal system and connective tissue: Secondary | ICD-10-CM | POA: Insufficient documentation

## 2013-01-16 DIAGNOSIS — R079 Chest pain, unspecified: Secondary | ICD-10-CM | POA: Insufficient documentation

## 2013-01-16 DIAGNOSIS — Z791 Long term (current) use of non-steroidal anti-inflammatories (NSAID): Secondary | ICD-10-CM | POA: Insufficient documentation

## 2013-01-16 MED ORDER — AZITHROMYCIN 250 MG PO TABS: ORAL_TABLET | ORAL | Status: AC

## 2013-01-16 MED ORDER — ALBUTEROL SULFATE HFA 108 (90 BASE) MCG/ACT IN AERS
2.0000 | INHALATION_SPRAY | Freq: Once | RESPIRATORY_TRACT | Status: AC
  Administered 2013-01-16: 2 via RESPIRATORY_TRACT
  Filled 2013-01-16: qty 6.7

## 2013-01-16 NOTE — ED Provider Notes (Signed)
CSN: [id number]     Arrival date & time 01/16/13  2026 History   First MD Initiated Contact with Patient 01/16/13 2139     Chief Complaint  Patient presents with  . Cough   (Consider location/radiation/quality/duration/timing/severity/associated sxs/prior Treatment) Patient is a 37 y.o. female presenting with cough. The history is provided by the patient.  Cough Cough characteristics:  Non-productive Severity:  Moderate Onset quality:  Gradual Duration:  3 weeks Timing:  Intermittent Progression:  Worsening Chronicity:  New Smoker: yes   Relieved by:  None tried Associated symptoms: chest pain (when she coughs)   Associated symptoms: no chills, no ear pain, no fever, no headaches, no myalgias, no rhinorrhea, no shortness of breath, no sinus congestion, no sore throat and no wheezing     Past Medical History  Diagnosis Date  . Ovarian cyst, right   . Lumbar radiculopathy, chronic   . Obesity   . Ovarian cyst   . Tobacco user     Since age 81  . Carpal tunnel syndrome of left wrist     Wears splint at night  . Cervical radiculopathy   . OVARIAN CYST 03/17/2010    Qualifier: Diagnosis of  By: Janalyn Harder MD, Cat     Past Surgical History  Procedure Laterality Date  . Tubal ligation  06/2002  . Tonsilectomy, adenoidectomy, bilateral myringotomy and tubes     Family History  Problem Relation Age of Onset  . Fibroids Sister   . Heart disease Maternal Grandfather   . Hypertension Maternal Grandfather   . Diabetes Maternal Grandfather   . Cancer Paternal Grandmother   . Sudden death Neg Hx   . Hyperlipidemia Neg Hx   . Heart attack Neg Hx    History  Substance Use Topics  . Smoking status: Current Every Day Smoker -- 1.00 packs/day    Types: Cigarettes  . Smokeless tobacco: Never Used     Comment: Smoked since age 36/will quitt 11/22/11  . Alcohol Use: Yes     Comment: socially maybe 3 drinks at a time, once a month   OB History   Grav Para Term Preterm Abortions TAB SAB  Ect Mult Living   7 3 3  0 4 0 4 0 0 3     Obstetric Comments   [id number]:  First pregnancy was complicated by syncope.  6th pregancy had be best rest.  7th pregnancy was normal.   1: 1996-1997: son 2: 1997: 1st SAB 3: 1998: 1st SAB 4: 1999: 1st SAB 5: 2001: 1st SAB 6: 2002: son 7: 2003: daughter     Review of Systems  Constitutional: Negative for fever and chills.  HENT: Negative for ear pain, sore throat and rhinorrhea.   Respiratory: Positive for cough. Negative for shortness of breath and wheezing.   Cardiovascular: Positive for chest pain (when she coughs).  Gastrointestinal: Negative for vomiting and abdominal pain.  Musculoskeletal: Negative for myalgias.  Neurological: Negative for headaches.  All other systems reviewed and are negative.    Allergies  Sulfonamide derivatives  Home Medications   Current Outpatient Rx  Name  Route  Sig  Dispense  Refill  . Phenyleph-Doxylamine-DM-APAP (TYLENOL COLD MULTI-SYMPTOM) 5-6.25-10-325 MG/15ML LIQD   Oral   Take by mouth.         . naproxen (EC NAPROSYN) 500 MG EC tablet   Oral   Take 1 tablet (500 mg total) by mouth 2 (two) times daily with a meal.   60 tablet   2  BP 143/72  Pulse 95  Temp(Src) 98.4 F (36.9 C) (Oral)  Resp 16  Ht 5\' 3"  (1.6 m)  Wt 260 lb (117.935 kg)  BMI 46.07 kg/m2  SpO2 97%  LMP 01/03/2013 Physical Exam  Nursing note and vitals reviewed. Constitutional: She is oriented to person, place, and time. She appears well-developed and well-nourished.  HENT:  Head: Normocephalic and atraumatic.  Right Ear: External ear normal.  Left Ear: External ear normal.  Nose: Nose normal.  Mouth/Throat: Oropharynx is clear and moist. No oropharyngeal exudate.  Eyes: Right eye exhibits no discharge. Left eye exhibits no discharge.  Neck: Neck supple.  Cardiovascular: Normal rate, regular rhythm and normal heart sounds.   Pulmonary/Chest: Effort normal and breath sounds normal. She has no wheezes. She  exhibits tenderness.  Abdominal: Soft. There is no tenderness.  Lymphadenopathy:    She has no cervical adenopathy.  Neurological: She is alert and oriented to person, place, and time.  Skin: Skin is warm and dry.    ED Course  Procedures (including critical care time) Labs Review Labs Reviewed - No data to display Imaging Review Dg Chest 2 View  01/16/2013   *RADIOLOGY REPORT*  Clinical Data: Cough, chest pain, shortness of breath.  CHEST - 2 VIEW  Comparison: 12/02/2010 and earlier studies  Findings: Lungs clear.  Heart size and pulmonary vascularity normal.  No effusion.  Visualized bones unremarkable.  IMPRESSION: No acute disease   Original Report Authenticated By: D. Andria Rhein, MD    Date: 01/16/2013  Rate: 69  Rhythm: normal sinus rhythm  QRS Axis: normal  Intervals: normal  ST/T Wave abnormalities: normal  Conduction Disutrbances:none  Narrative Interpretation: NSR, no ischemia  Old EKG Reviewed: none available   MDM   1. Cough    37 year old female with 3 weeks of nonproductive cough. X-ray is negative for pneumonia. I counseled her on stopping smoking. There is no sign of RAD here, but as her cough is most c/w at night, will give an inhaler. Will treat with zpack due to prolonged cough as well and recommend close f/u with her PCP. Her chest wall pain seems c/w MSK etiology from prolonged cough, as she has no ACS risk factors and a normal EKG I feel there is little concern for ACS or further w/u.    Audree Camel, MD 01/17/13 [phone]

## 2013-01-16 NOTE — ED Notes (Signed)
Pt c/o cough producing yellow sputum x 3 weeks. Pt denies fever.

## 2013-01-16 NOTE — Patient Instructions (Signed)
Patient instructed on the proper use of administering albuteral mdi via aerochamber patient tolerated well

## 2013-01-16 NOTE — ED Notes (Signed)
Pt came into ED from triage with a face mask on.

## 2014-02-11 ENCOUNTER — Encounter (HOSPITAL_BASED_OUTPATIENT_CLINIC_OR_DEPARTMENT_OTHER): Payer: Self-pay | Admitting: Emergency Medicine

## 2014-02-11 ENCOUNTER — Emergency Department (HOSPITAL_BASED_OUTPATIENT_CLINIC_OR_DEPARTMENT_OTHER): Payer: BC Managed Care – PPO

## 2014-02-11 ENCOUNTER — Emergency Department (HOSPITAL_BASED_OUTPATIENT_CLINIC_OR_DEPARTMENT_OTHER)
Admission: EM | Admit: 2014-02-11 | Discharge: 2014-02-11 | Disposition: A | Discharge status: Home / Self Care | Payer: BC Managed Care – PPO | Attending: Emergency Medicine | Admitting: Emergency Medicine

## 2014-02-11 DIAGNOSIS — Z8669 Personal history of other diseases of the nervous system and sense organs: Secondary | ICD-10-CM | POA: Insufficient documentation

## 2014-02-11 DIAGNOSIS — Z8742 Personal history of other diseases of the female genital tract: Secondary | ICD-10-CM | POA: Diagnosis not present

## 2014-02-11 DIAGNOSIS — R05 Cough: Secondary | ICD-10-CM | POA: Diagnosis present

## 2014-02-11 DIAGNOSIS — R059 Cough, unspecified: Secondary | ICD-10-CM | POA: Diagnosis present

## 2014-02-11 DIAGNOSIS — Z792 Long term (current) use of antibiotics: Secondary | ICD-10-CM | POA: Diagnosis not present

## 2014-02-11 DIAGNOSIS — Z791 Long term (current) use of non-steroidal anti-inflammatories (NSAID): Secondary | ICD-10-CM | POA: Diagnosis not present

## 2014-02-11 DIAGNOSIS — Z8739 Personal history of other diseases of the musculoskeletal system and connective tissue: Secondary | ICD-10-CM | POA: Diagnosis not present

## 2014-02-11 DIAGNOSIS — F17200 Nicotine dependence, unspecified, uncomplicated: Secondary | ICD-10-CM | POA: Insufficient documentation

## 2014-02-11 DIAGNOSIS — J069 Acute upper respiratory infection, unspecified: Secondary | ICD-10-CM | POA: Diagnosis not present

## 2014-02-11 DIAGNOSIS — E669 Obesity, unspecified: Secondary | ICD-10-CM | POA: Diagnosis not present

## 2014-02-11 MED ORDER — GUAIFENESIN-CODEINE 100-10 MG/5ML PO SOLN: 10.0000 mL | Freq: Four times a day (QID) | ORAL | Status: AC | PRN

## 2014-02-11 NOTE — ED Provider Notes (Signed)
CSN: [id number]     Arrival date & time 02/11/14  1510 History  This chart was scribed for Carla Lyons, MD by Charline Bills, ED Scribe. The patient was seen in room MH08/MH08. Patient's care was started at 4:05 PM.   Chief Complaint  Patient presents with  . Cough   Patient is a 38 y.o. female presenting with cough. The history is provided by the patient. No language interpreter was used.  Cough Cough characteristics:  Productive Sputum characteristics:  Bloody Severity:  Moderate Duration:  3 days Progression:  Worsening Chronicity:  New Smoker: yes   Associated symptoms: no ear pain and no fever    HPI Comments: Carla Walters is a 38 y.o. female who presents to the Emergency Department complaining of worsening cough over the past 3 days. Pt reports hemoptysis this morning that has resolved. Pt states that she coughed up clots of blood and noted blood on the pillow upon waking. She denies fever, sinus pressure, ear pain. Pt reports sick contacts at work. Pt is a smoker.  Past Medical History  Diagnosis Date  . Ovarian cyst, right   . Lumbar radiculopathy, chronic   . Obesity   . Ovarian cyst   . Tobacco user     Since age 35  . Carpal tunnel syndrome of left wrist     Wears splint at night  . Cervical radiculopathy   . OVARIAN CYST 03/17/2010    Qualifier: Diagnosis of  By: Janalyn Harder MD, Cat     Past Surgical History  Procedure Laterality Date  . Tubal ligation  06/2002  . Tonsilectomy, adenoidectomy, bilateral myringotomy and tubes     Family History  Problem Relation Age of Onset  . Fibroids Sister   . Heart disease Maternal Grandfather   . Hypertension Maternal Grandfather   . Diabetes Maternal Grandfather   . Cancer Paternal Grandmother   . Sudden death Neg Hx   . Hyperlipidemia Neg Hx   . Heart attack Neg Hx    History  Substance Use Topics  . Smoking status: Current Every Day Smoker -- 1.00 packs/day    Types: Cigarettes  . Smokeless tobacco: Never Used   Comment: Smoked since age 64/will quitt 11/22/11  . Alcohol Use: Yes   OB History   Grav Para Term Preterm Abortions TAB SAB Ect Mult Living   0 4 0 4 0 0 3     Obstetric Comments   [id number]:  First pregnancy was complicated by syncope.  6th pregancy had be best rest.  7th pregnancy was normal.   1: 1996-1997: son 2: 1997: 1st SAB 3: 1998: 1st SAB 4: 1999: 1st SAB 5: 2001: 1st SAB 6: 2002: son 7: 2003: daughter     Review of Systems  Constitutional: Negative for fever.  HENT: Negative for ear pain and sinus pressure.   Respiratory: Positive for cough.   All other systems reviewed and are negative.  Allergies  Sulfonamide derivatives  Home Medications   Prior to Admission medications   Medication Sig Start Date End Date Taking? Authorizing Provider  Pseudoeph-CPM-DM-APAP (TYLENOL COLD PO) Take by mouth.   Yes Historical Provider, MD  azithromycin (ZITHROMAX Z-PAK) 250 MG tablet 2 po day one, then 1 daily x 4 days 01/16/13   Audree Camel, MD  naproxen (EC NAPROSYN) 500 MG EC tablet Take 1 tablet (500 mg total) by mouth 2 (two) times daily with a meal. 04/26/12   Tito Dine, MD  Phenyleph-Doxylamine-DM-APAP (  TYLENOL COLD MULTI-SYMPTOM) 5-6.25-10-325 MG/15ML LIQD Take by mouth.    Historical Provider, MD   Triage Vitals: BP 136/90  Pulse 88  Temp(Src) 98.6 F (37 C) (Oral)  Resp 20  Ht  (1.6 m)  Wt 270 lb (122.471 kg)  BMI 47.84 kg/m2  SpO2 100%  LMP 01/25/2014 Physical Exam  Nursing note and vitals reviewed. Constitutional: She is oriented to person, place, and time. She appears well-developed and well-nourished. No distress.  HENT:  Head: Normocephalic and atraumatic.  Mouth/Throat: Oropharynx is clear and moist. No oropharyngeal exudate.  TMs are clear bilaterally.   Eyes: Conjunctivae and EOM are normal.  Neck: Neck supple. No tracheal deviation present.  Cardiovascular: Normal rate.   Pulmonary/Chest: Effort normal. No respiratory distress.  She has no wheezes. She has no rales.  Musculoskeletal: Normal range of motion.  Lymphadenopathy:    She has no cervical adenopathy.  Neurological: She is alert and oriented to person, place, and time.  Skin: Skin is warm and dry.  Psychiatric: She has a normal mood and affect. Her behavior is normal.   ED Course  Procedures (including critical care time) DIAGNOSTIC STUDIES: Oxygen Saturation is 100% on RA, normal by my interpretation.    COORDINATION OF CARE: 4:10 PM-Discussed treatment plan which includes CXR and cough suppressant with pt at bedside and pt agreed to plan.   Labs Review Labs Reviewed - No data to display  Imaging Review Dg Chest 2 View  02/11/2014   CLINICAL DATA:  Three days of cough and chest pain; hematemesis today.  EXAM: CHEST  2 VIEW  COMPARISON:  PA and lateral chest x-ray of January 16, 2013  FINDINGS: The lungs are adequately inflated. There is no focal infiltrate. No parenchymal masses are demonstrated. The heart and pulmonary vascularity are normal. There is no pleural effusion. The mediastinum is normal in width. The bony thorax is normal.  IMPRESSION: There is no acute cardiopulmonary abnormality.   Electronically Signed   By: David  [place]   On: 02/11/2014 15:53    EKG Interpretation None      MDM   Final diagnoses:  None    Patient presents with complaints of one-week history of cough which is nonproductive with blood-tinged sputum. She feels tight in the chest. She is a smoker. She will be treated with cough medication and Zithromax for presumed bronchitis. Chest x-ray reveals no evidence for pneumonia she is not hypoxic. She is to return if her symptoms worsen or change.  I personally performed the services described in this documentation, which was scribed in my presence. The recorded information has been reviewed and is accurate.    Carla Lyons, MD 02/12/14 [phone]

## 2014-02-11 NOTE — ED Notes (Signed)
MD at bedside. 

## 2014-02-11 NOTE — Discharge Instructions (Signed)
Robitussin with codeine as prescribed as needed for cough.  Return to the emergency department if you develop difficulty breathing, severe chest pain, or other new and concerning symptoms.   Cough, Adult  A cough is a reflex that helps clear your throat and airways. It can help heal the body or may be a reaction to an irritated airway. A cough may only last 2 or 3 weeks (acute) or may last more than 8 weeks (chronic).  CAUSES Acute cough:  Viral or bacterial infections. Chronic cough:  Infections.  Allergies.  Asthma.  Post-nasal drip.  Smoking.  Heartburn or acid reflux.  Some medicines.  Chronic lung problems (COPD).  Cancer. SYMPTOMS   Cough.  Fever.  Chest pain.  Increased breathing rate.  High-pitched whistling sound when breathing (wheezing).  Colored mucus that you cough up (sputum). TREATMENT   A bacterial cough may be treated with antibiotic medicine.  A viral cough must run its course and will not respond to antibiotics.  Your caregiver may recommend other treatments if you have a chronic cough. HOME CARE INSTRUCTIONS   Only take over-the-counter or prescription medicines for pain, discomfort, or fever as directed by your caregiver. Use cough suppressants only as directed by your caregiver.  Use a cold steam vaporizer or humidifier in your bedroom or home to help loosen secretions.  Sleep in a semi-upright position if your cough is worse at night.  Rest as needed.  Stop smoking if you smoke. SEEK IMMEDIATE MEDICAL CARE IF:   You have pus in your sputum.  Your cough starts to worsen.  You cannot control your cough with suppressants and are losing sleep.  You begin coughing up blood.  You have difficulty breathing.  You develop pain which is getting worse or is uncontrolled with medicine.  You have a fever. MAKE SURE YOU:   Understand these instructions.  Will watch your condition.  Will get help right away if you are not doing  well or get worse. Document Released: 11/06/2010 Document Revised: 08/02/2011 Document Reviewed: 11/06/2010 [facility] Patient Information 2015 [place], [place]. This information is not intended to replace advice given to you by your health care provider. Make sure you discuss any questions you have with your health care provider.  Viral Infections A viral infection can be caused by different types of viruses.Most viral infections are not serious and resolve on their own. However, some infections may cause severe symptoms and may lead to further complications. SYMPTOMS Viruses can frequently cause:  Minor sore throat.  Aches and pains.  Headaches.  Runny nose.  Different types of rashes.  Watery eyes.  Tiredness.  Cough.  Loss of appetite.  Gastrointestinal infections, resulting in nausea, vomiting, and diarrhea. These symptoms do not respond to antibiotics because the infection is not caused by bacteria. However, you might catch a bacterial infection following the viral infection. This is sometimes called a "superinfection." Symptoms of such a bacterial infection may include:  Worsening sore throat with pus and difficulty swallowing.  Swollen neck glands.  Chills and a high or persistent fever.  Severe headache.  Tenderness over the sinuses.  Persistent overall ill feeling (malaise), muscle aches, and tiredness (fatigue).  Persistent cough.  Yellow, green, or brown mucus production with coughing. HOME CARE INSTRUCTIONS   Only take over-the-counter or prescription medicines for pain, discomfort, diarrhea, or fever as directed by your caregiver.  Drink enough water and fluids to keep your urine clear or pale yellow. Sports drinks can provide valuable electrolytes,  sugars, and hydration.  Get plenty of rest and maintain proper nutrition. Soups and broths with crackers or rice are fine. SEEK IMMEDIATE MEDICAL CARE IF:   You have severe headaches, shortness of breath,  chest pain, neck pain, or an unusual rash.  You have uncontrolled vomiting, diarrhea, or you are unable to keep down fluids.  You or your child has an oral temperature above 102 F (38.9 C), not controlled by medicine.  Your baby is older than 3 months with a rectal temperature of 102 F (38.9 C) or higher.  Your baby is 40 months old or younger with a rectal temperature of 100.4 F (38 C) or higher. MAKE SURE YOU:   Understand these instructions.  Will watch your condition.  Will get help right away if you are not doing well or get worse. Document Released: 02/17/2005 Document Revised: 08/02/2011 Document Reviewed: 09/14/2010 [facility] Patient Information 2015 [place], [place]. This information is not intended to replace advice given to you by your health care provider. Make sure you discuss any questions you have with your health care provider.

## 2014-02-11 NOTE — ED Notes (Signed)
C/o cough since 9/17-bloody cough this am

## 2014-03-25 ENCOUNTER — Encounter (HOSPITAL_BASED_OUTPATIENT_CLINIC_OR_DEPARTMENT_OTHER): Payer: Self-pay | Admitting: Emergency Medicine
